# Patient Record
Sex: Female | Born: 1985 | Race: Black or African American | Hispanic: No | Marital: Married | Smoking: Never smoker
Health system: Southern US, Community
[De-identification: ages and names within clinical notes are randomized; demographics above are authoritative.]

## PROBLEM LIST (undated history)

## (undated) DIAGNOSIS — R87629 Unspecified abnormal cytological findings in specimens from vagina: Secondary | ICD-10-CM

## (undated) DIAGNOSIS — D649 Anemia, unspecified: Secondary | ICD-10-CM

## (undated) HISTORY — DX: Unspecified abnormal cytological findings in specimens from vagina: R87.629

## (undated) HISTORY — DX: Anemia, unspecified: D64.9

---

## 2011-09-11 DIAGNOSIS — Q6589 Other specified congenital deformities of hip: Secondary | ICD-10-CM

## 2016-01-12 ENCOUNTER — Encounter (INDEPENDENT_AMBULATORY_CARE_PROVIDER_SITE_OTHER): Payer: Self-pay

## 2016-01-12 ENCOUNTER — Encounter: Payer: Self-pay | Admitting: Certified Nurse Midwife

## 2016-01-12 ENCOUNTER — Encounter: Payer: Self-pay | Admitting: *Deleted

## 2016-01-12 ENCOUNTER — Ambulatory Visit (INDEPENDENT_AMBULATORY_CARE_PROVIDER_SITE_OTHER): Payer: Managed Care, Other (non HMO) | Admitting: Certified Nurse Midwife

## 2016-01-12 VITALS — BP 120/78 | HR 107 | Ht 65.0 in | Wt 170.0 lb

## 2016-01-12 DIAGNOSIS — Z23 Encounter for immunization: Secondary | ICD-10-CM

## 2016-01-12 DIAGNOSIS — O0932 Supervision of pregnancy with insufficient antenatal care, second trimester: Secondary | ICD-10-CM | POA: Diagnosis not present

## 2016-01-12 DIAGNOSIS — O0930 Supervision of pregnancy with insufficient antenatal care, unspecified trimester: Secondary | ICD-10-CM

## 2016-01-12 DIAGNOSIS — Z3689 Encounter for other specified antenatal screening: Secondary | ICD-10-CM | POA: Diagnosis not present

## 2016-01-12 DIAGNOSIS — Z113 Encounter for screening for infections with a predominantly sexual mode of transmission: Secondary | ICD-10-CM

## 2016-01-12 DIAGNOSIS — Z1151 Encounter for screening for human papillomavirus (HPV): Secondary | ICD-10-CM

## 2016-01-12 DIAGNOSIS — Z3480 Encounter for supervision of other normal pregnancy, unspecified trimester: Secondary | ICD-10-CM | POA: Insufficient documentation

## 2016-01-12 DIAGNOSIS — O9989 Other specified diseases and conditions complicating pregnancy, childbirth and the puerperium: Secondary | ICD-10-CM

## 2016-01-12 DIAGNOSIS — N898 Other specified noninflammatory disorders of vagina: Secondary | ICD-10-CM

## 2016-01-12 DIAGNOSIS — Z124 Encounter for screening for malignant neoplasm of cervix: Secondary | ICD-10-CM

## 2016-01-12 NOTE — Patient Instructions (Signed)
AREA PEDIATRIC/FAMILY PRACTICE PHYSICIANS  [facility] [address] [place], [place] [address] Phone - [phone]   Fax - [phone]  [facility] AM[facility] [address] [place], [place]  [address] Phone - [phone]   Fax - [phone]  [facility] [address] [place], [place]  [address] Phone - [phone]   Fax - [phone]  [facility] [address] [place], [place]  [address] Phone - [phone]   Fax - [phone]  [facility] [address], [address] [place], [place]  [address] Phone - [phone]   Fax - [phone]  [facility] [address] [place], [place]  [address] Phone - [phone]   Fax - [phone]  [facility] [address] [place], [place]  [address] Phone - [phone]   Fax - [phone]  [facility] [address], [address] [place], [place]  [address] Phone - [phone]   Fax - [phone]  [facility] [address] [place], [place]  [address] Phone - [phone]   Fax - [phone] [facility] [address] [place], [place]  [address] Phone - [phone]   Fax - [phone]  [facility] [address] [place], [place]  [address] Phone - [phone]   Fax - [phone]  [facility] [address] [place], [place]  [address] Phone - [phone]   Fax - [phone]  [facility] [address] [place], [place]  [address] Phone - [phone]   Fax - [phone]  SHILPA GOSRANI [address] [place], [place]  [address] Phone - [phone]  [facility] [address] [place], [place]  [address] Phone - [phone]   Fax - [phone]  [facility] [address] [place], [place]  [address] Phone - [phone]   Fax - [phone]  [facility] [address] [place], [place]  [address] Phone - [phone]   Fax - [phone]  [facility] [address] [place], [place]  [address] Phone - [phone]   Fax - [phone]   [facility] [address] [place], [place]  [address] Phone - [phone]   Fax - [phone]  [facility] [address] [place], [place]  [address] Phone - [phone]   Fax - [phone]  [facility] RUB[facility] [address] [place], [place]  [address] Phone - [phone]   Fax - [phone]  [facility] [address] [place], [place]  [address] Phone - [phone]   Fax - [phone]  [facility] [address] , [place]  [address] Phone - [phone]   Fax - [phone] [facility] [address] [place], [place]  [address] Phone - [phone]   Fax - [phone]  [facility] [address] [place], [place]  [address] Phone - [phone]   Fax - [phone]  [facility] [address] [place],   [address] Phone - [phone]   Fax - [phone]   

## 2016-01-12 NOTE — Progress Notes (Signed)
Pt here today as a NOB.  She has not had any previous OB care.  She desires a Systems developerwaterbirth.  She [place] one [place]  NOB labs drawn today and [place]

## 2016-01-12 NOTE — Progress Notes (Signed)
Subjective:  Kathleen PitmanChristina Morrison is a 30 y.o. G2P1001 at 8922w6d being seen today for her initial prenatal care Morrison.  She is currently monitored for the following issues for this low-risk pregnancy and has Supervision of other normal pregnancy, antepartum on her problem list.  Patient reports malodorous white vaginal discharge.  Contractions: Not present. Vag. Bleeding: None.  Movement: Present. Denies leaking of fluid.   The following portions of the patient's history were reviewed and updated as appropriate: allergies, current medications, past family history, past medical history, past social history, past surgical history and problem list. Problem list updated.  Objective:   Vitals:   01/12/16 0902 01/12/16 0910  BP: 120/78   Pulse: (!) 107   Weight: 170 lb (77.1 kg)   Height:  5\' 5"  (1.651 m)    Fetal Status: Fetal Heart Rate (bpm): 147 Fundal Height: 24 cm Movement: Present     General:  Alert, oriented and cooperative. Patient is in no acute distress.  Skin: Skin is warm and dry. No rash noted.   Cardiovascular: Normal heart rate and rhythm noted  Respiratory: Normal respiratory effort, no problems with respiration noted, CTAB  Abdomen: Soft, gravid, appropriate for gestational age. Pain/Pressure: Absent     Pelvic: Vag. Bleeding: None Vag D/C Character: Frothy  White, moderate Cervical exam performed Dilation: Closed Effacement (%): 0    Extremities: Normal range of motion.  Edema: None  Mental Status: Normal mood and affect. Normal behavior. Normal judgment and thought content.                    Breast: nml bilaterally  Urinalysis: Urine Protein: Negative Urine Glucose: Negative  Assessment and Plan:  Pregnancy: G2P1001 at 4822w6d  1. Supervision of other normal pregnancy, antepartum  - Flu Vaccine QUAD 36+ mos IM (Fluarix, Quad PF) - interested in WB, discussed class offerings and requirement  2. Late prenatal care  - Prenatal Profile - Hemoglobin A1c - Culture, OB  Urine - Cytology - PAP - Pain Mgmt, Profile 6 Conf w/o mM, U - Glucose - US MFM OB COMP + 14 WK; Future  3. Vaginal odor - likely BV based on exam - WET PREP FOR TRICH, YEAST, CLUE  Preterm labor symptoms and general obstetric precautions including but not limited to vaginal bleeding, contractions, leaking of fluid and fetal movement were reviewed in detail with the patient. Please refer to After Morrison Summary for other counseling recommendations.  Return in about 4 weeks (around 02/09/2016).   Donette LarryMelanie Janelie Goltz, CNM

## 2016-01-13 ENCOUNTER — Telehealth: Payer: Self-pay | Admitting: *Deleted

## 2016-01-13 DIAGNOSIS — N760 Acute vaginitis: Principal | ICD-10-CM

## 2016-01-13 DIAGNOSIS — B9689 Other specified bacterial agents as the cause of diseases classified elsewhere: Secondary | ICD-10-CM

## 2016-01-13 LAB — PRENATAL PROFILE (SOLSTAS)
Antibody Screen: NEGATIVE
BASOS ABS: 0 {cells}/uL (ref 0–200)
Basophils Relative: 0 %
EOS ABS: 48 {cells}/uL (ref 15–500)
Eosinophils Relative: 1 %
HEMATOCRIT: 33.6 % — AB (ref 35.0–45.0)
HIV: NONREACTIVE
Hemoglobin: 10.7 g/dL — ABNORMAL LOW (ref 11.7–15.5)
Hepatitis B Surface Ag: NEGATIVE
LYMPHS ABS: 1104 {cells}/uL (ref 850–3900)
Lymphocytes Relative: 23 %
MCH: 28.1 pg (ref 27.0–33.0)
MCHC: 31.8 g/dL — ABNORMAL LOW (ref 32.0–36.0)
MCV: 88.2 fL (ref 80.0–100.0)
MPV: 9.4 fL (ref 7.5–12.5)
Monocytes Absolute: 288 cells/uL (ref 200–950)
Monocytes Relative: 6 %
NEUTROS PCT: 70 %
Neutro Abs: 3360 cells/uL (ref 1500–7800)
Platelets: 240 10*3/uL (ref 140–400)
RBC: 3.81 MIL/uL (ref 3.80–5.10)
RDW: 14.4 % (ref 11.0–15.0)
Rh Type: POSITIVE
Rubella: 2.35 Index — ABNORMAL HIGH (ref <0.90)
WBC: 4.8 10*3/uL (ref 3.8–10.8)

## 2016-01-13 LAB — PAIN MGMT, PROFILE 6 CONF W/O MM, U
6 ACETYLMORPHINE: NEGATIVE ng/mL (ref <10)
ALCOHOL METABOLITES: NEGATIVE ng/mL (ref <500)
Amphetamines: NEGATIVE ng/mL (ref <500)
BARBITURATES: NEGATIVE ng/mL (ref <300)
Benzodiazepines: NEGATIVE ng/mL (ref <100)
Cocaine Metabolite: NEGATIVE ng/mL (ref <150)
Creatinine: 197.4 mg/dL (ref >=20.0)
METHADONE METABOLITE: NEGATIVE ng/mL (ref <100)
Marijuana Metabolite: NEGATIVE ng/mL (ref <20)
Opiates: NEGATIVE ng/mL (ref <100)
Oxidant: NEGATIVE ug/mL (ref <200)
Oxycodone: NEGATIVE ng/mL (ref <100)
PH: 6.36 (ref 4.5–9.0)
PHENCYCLIDINE: NEGATIVE ng/mL (ref <25)
Please note:: 0

## 2016-01-13 LAB — GLUCOSE, RANDOM: GLUCOSE: 81 mg/dL (ref 65–99)

## 2016-01-13 LAB — WET PREP FOR TRICH, YEAST, CLUE
TRICH WET PREP: NONE SEEN
YEAST WET PREP: NONE SEEN

## 2016-01-13 LAB — HEMOGLOBIN A1C
Hgb A1c MFr Bld: 5 % (ref <5.7)
MEAN PLASMA GLUCOSE: 97 mg/dL

## 2016-01-13 MED ORDER — METRONIDAZOLE 500 MG PO TABS: 500.0000 mg | ORAL_TABLET | Freq: Two times a day (BID) | ORAL | 0 refills | Status: DC

## 2016-01-13 NOTE — Telephone Encounter (Signed)
Pt notified of positive BV and [facility] Marice Potterove.

## 2016-01-14 LAB — CYTOLOGY - PAP
Chlamydia: NEGATIVE
Diagnosis: NEGATIVE
HPV: NOT DETECTED
Neisseria Gonorrhea: NEGATIVE

## 2016-01-14 LAB — CULTURE, OB URINE
COLONY COUNT: NO GROWTH
ORGANISM ID, BACTERIA: NO GROWTH

## 2016-01-19 ENCOUNTER — Ambulatory Visit (HOSPITAL_COMMUNITY)
Admission: RE | Admit: 2016-01-19 | Discharge: 2016-01-19 | Disposition: A | Discharge status: Home / Self Care | Payer: Managed Care, Other (non HMO) | Source: Ambulatory Visit | Attending: Certified Nurse Midwife | Admitting: Certified Nurse Midwife

## 2016-01-19 DIAGNOSIS — O0932 Supervision of pregnancy with insufficient antenatal care, second trimester: Secondary | ICD-10-CM | POA: Diagnosis not present

## 2016-01-19 DIAGNOSIS — Z3A24 24 weeks gestation of pregnancy: Secondary | ICD-10-CM | POA: Insufficient documentation

## 2016-01-19 DIAGNOSIS — O0930 Supervision of pregnancy with insufficient antenatal care, unspecified trimester: Secondary | ICD-10-CM

## 2016-01-19 DIAGNOSIS — Z3480 Encounter for supervision of other normal pregnancy, unspecified trimester: Secondary | ICD-10-CM

## 2016-01-19 DIAGNOSIS — Z363 Encounter for antenatal screening for malformations: Secondary | ICD-10-CM | POA: Insufficient documentation

## 2016-01-21 ENCOUNTER — Encounter: Payer: Self-pay | Admitting: Family Medicine

## 2016-01-22 NOTE — Addendum Note (Signed)
Encounter addended by: Donette LarryMelanie Sajad Glander, CNM on: 01/22/2016  8:43 AM<BR>    Actions taken: Visit diagnoses modified, Problem List modified

## 2016-02-02 NOTE — L&D Delivery Note (Signed)
Delivery Note At 12:44 AM a viable female was delivered via waterbirth   (Presentation: LOA ; nuchal cord around foot x 2  ).  APGAR: pending, ; weight  .   Placenta status: complete and intact.  Cord:  with the following complications: .  Cord pH: NA  Anesthesia:  None  Episiotomy:  None  Lacerations:  Hemostatic first degree, no repair Suture Repair: NA Est. Blood Loss (mL):  200 cc   Mom to postpartum.  Baby to Couplet care / Skin to Skin.  Tawnya Crook 05/06/2016, 1:13 AM

## 2016-02-13 ENCOUNTER — Ambulatory Visit (INDEPENDENT_AMBULATORY_CARE_PROVIDER_SITE_OTHER): Payer: Managed Care, Other (non HMO) | Admitting: Advanced Practice Midwife

## 2016-02-13 VITALS — BP 134/73 | HR 105 | Wt 175.0 lb

## 2016-02-13 DIAGNOSIS — Z3492 Encounter for supervision of normal pregnancy, unspecified, second trimester: Secondary | ICD-10-CM

## 2016-02-13 DIAGNOSIS — Z3493 Encounter for supervision of normal pregnancy, unspecified, third trimester: Secondary | ICD-10-CM

## 2016-02-13 DIAGNOSIS — Z3480 Encounter for supervision of other normal pregnancy, unspecified trimester: Secondary | ICD-10-CM

## 2016-02-13 DIAGNOSIS — O26813 Pregnancy related exhaustion and fatigue, third trimester: Secondary | ICD-10-CM

## 2016-02-13 DIAGNOSIS — Z23 Encounter for immunization: Secondary | ICD-10-CM

## 2016-02-13 LAB — TSH: TSH: 1.3 mIU/L

## 2016-02-13 LAB — CBC
HCT: 31.3 % — ABNORMAL LOW (ref 35.0–45.0)
HEMOGLOBIN: 10 g/dL — AB (ref 11.7–15.5)
MCH: 28.2 pg (ref 27.0–33.0)
MCHC: 31.9 g/dL — AB (ref 32.0–36.0)
MCV: 88.2 fL (ref 80.0–100.0)
MPV: 9.3 fL (ref 7.5–12.5)
PLATELETS: 222 10*3/uL (ref 140–400)
RBC: 3.55 MIL/uL — ABNORMAL LOW (ref 3.80–5.10)
RDW: 14.4 % (ref 11.0–15.0)
WBC: 5.1 10*3/uL (ref 3.8–10.8)

## 2016-02-13 NOTE — Patient Instructions (Signed)
Considering Waterbirth? Guide for patients at [facility]  Why consider waterbirth?  . Gentle birth for babies . Less pain medicine used in labor . May allow for passive descent/less pushing . May reduce perineal tears  . More mobility and instinctive maternal position changes . Increased maternal relaxation . Reduced blood pressure in labor  Is waterbirth safe? What are the risks of infection, drowning or other complications?  . Infection: o Very low risk (3.7 % for tub vs 4.8% for bed) o 7 in 8000 waterbirths with documented infection o Poorly cleaned equipment most common cause o Slightly lower group B strep transmission rate  . Drowning o Maternal:  - Very low risk   - Related to seizures or fainting o Newborn:  - Very low risk. No evidence of increased risk of respiratory problems in multiple large studies - Physiological protection from breathing under water - Avoid underwater birth if there are any fetal complications - Once baby's head is out of the water, keep it out.  . Birth complication o Some reports of cord trauma, but risk decreased by bringing baby to surface gradually o No evidence of increased risk of shoulder dystocia. Mothers can usually change positions faster in water than in a bed, possibly aiding the maneuvers to free the shoulder.   Am I a candidate for waterbirth?  Yes, if you are: . Full-term (37 weeks or greater)  . Have had an uncomplicated pregnancy and labor  No, if you have: Marland Kitchen Preterm birth less than 37 weeks . Thick, particulate meconium stained fluid . Maternal fever over 101 . Heavy bleeding or signs of placental abruption . Pre-eclampsia  . Any abnormal fetal heart rate pattern . Breech presentation . Twins  . Very large baby . Active communicable infection (this does NOT include group B strep) . Significant limitation to mobility  Please remember that birth is unpredictable. Under certain unforeseeable  circumstances your provider may advise against giving birth in the tub. These decisions will be made on a case-by-case basis and with the safety of you and your baby as our highest priority.  Requirements for patients planning waterbirth  . Ask your midwife if you will be a candidate for waterbirth. . Attend the Barney Drain at [place] at [phone] or [phone] for dates and times. The class is free and we strongly encourage you to bring your support person. You will receive a certificate of participation to show to your midwife or doctor. . Supplies needed for [facility] and [facility] patients: o Single-use disposable tub liner (birthpoolinabox.com  REGULAR size) o New garden hose labeled "lead-free", "suitable for drinking water", "non-toxic" OR "water potable" o Garden hose to remove the dirty water o Faucet adaptor to attach hose to faucet         o Electric drain pump to remove water (We recommend 792 gallon per hour or greater pump.)  o Fish net o Bathing suit top (optional) o Long-handled mirror (optional)  [employer] sells tubs for $120 if you would rather purchase your own tub    Thinking About Waterbirth???  You must attend a Doren Custard class at [facility]  3rd Wednesday of every month from 7-9pm  [employer] by calling [phone] or online at [employer]Federal.[employer]  Bring [place] the certificate from the class  Waterbirth supplies needed for [facility] patients:  Our practice has a Heritage manager in a Box tub at the hospital that you can  borrow  You will need to purchase an accessory kit that has all needed supplies through [facility] [phone]) or online $175.00  Or you can purchase the supplies separately: o Single-use disposable tub liner for Birth Pool in a Box (REGULAR size) o New garden hose labeled "lead-free", "suitable for drinking  water", o Electric drain pump to remove water (We recommend 792 gallon per hour or greater pump.)  o  "non-toxic" OR "water potable" o Garden hose to remove the dirty water o Fish net o Bathing suit top (optional) o Long-handled mirror (optional)  [employer] sells tubs for ~ $120 if you would rather purchase your own tub.  They also sell accessories, liners.    Www.waterbirthsolutions.com for tub purchases and supplies  The Labor Ladies (www.thelaborladies.com) $275 for tub rental/set-up & take down/kit   [employer] information regarding doulas (labor support) who provide pool rentals:  IdentityList.se.htm   The Labor Ladies (www.thelaborladies.com)  IdentityList.se.htm   Things that would prevent you from having a waterbirth:  Premature, <37wks  Previous cesarean birth  Presence of thick meconium-stained fluid  Multiple gestation (Twins, triplets, etc.)  Uncontrolled diabetes or gestational diabetes requiring medication  Hypertension  Heavy vaginal bleeding  Non-reassuring fetal heart rate  Active infection (MRSA, etc.)  If your labor has to be induced and induction method requires continuous monitoring of the baby's heart rate  Other risks/issues identified by your obstetrical provider

## 2016-02-13 NOTE — Progress Notes (Signed)
To return next week for 28 week labs

## 2016-02-13 NOTE — Progress Notes (Signed)
   PRENATAL VISIT NOTE  Subjective:  Ginette PitmanChristina Donigan is a 31 y.o. G2P1001 at 2456w3d being seen today for ongoing prenatal care.  She is currently monitored for the following issues for this low-risk pregnancy and has Supervision of other normal pregnancy, antepartum and Late prenatal care affecting pregnancy, antepartum on her problem list.  Patient reports right hip pain, severe fatigue, severe mood swings, tearfulness. .  Contractions: Not present. Vag. Bleeding: None.  Movement: Present. Denies leaking of fluid.   The following portions of the patient's history were reviewed and updated as appropriate: allergies, current medications, past family history, past medical history, past social history, past surgical history and problem list. Problem list updated.  Objective:   Vitals:   02/13/16 0832  BP: 134/73  Pulse: (!) 105  Weight: 175 lb (79.4 kg)    Fetal Status: Fetal Heart Rate (bpm): 147 Fundal Height: 28 cm Movement: Present     General:  Alert, oriented and cooperative. Patient is in no acute distress.  Skin: Skin is warm and dry. No rash noted.   Cardiovascular: Normal heart rate noted  Respiratory: Normal respiratory effort, no problems with respiration noted  Abdomen: Soft, gravid, appropriate for gestational age. Pain/Pressure: Absent     Pelvic:  Cervical exam deferred        Extremities: Normal range of motion.  Edema: None  Mental Status: Normal mood and affect. Normal behavior. Normal judgment and thought content.   Assessment and Plan:  Pregnancy: G2P1001 at 4356w3d  1. Normal pregnancy in second trimester  (pt ate so 2 hour GTT scheduled for [place] - CBC - HIV antibody (with reflex) - RPR - Tdap vaccine greater than or equal to 7yo IM - Glucose Tolerance, 2 Hours w/1 Hour  2. Supervision of other normal pregnancy, antepartum   3. Pregnancy related fatigue in third trimester - TSH  Preterm labor symptoms and general obstetric precautions including but not  limited to vaginal bleeding, contractions, leaking of fluid and fetal movement were reviewed in detail with the patient. Please refer to After Visit Summary for other counseling recommendations.  Recommend maternity support belt, counseling, walks, prenatal yoga. Not interested in meds yet.  Discussed normal difference w/ second pregnancy. Return in 2 weeks (on 02/27/2016).   Dorathy KinsmanVirginia Leonidus Rowand, CNM

## 2016-02-14 LAB — RPR

## 2016-02-14 LAB — HIV ANTIBODY (ROUTINE TESTING W REFLEX): HIV: NONREACTIVE

## 2016-02-17 ENCOUNTER — Telehealth: Payer: Self-pay | Admitting: *Deleted

## 2016-02-17 NOTE — Telephone Encounter (Signed)
Pt notified of labs and she will start FeSo4 and Colace daily.

## 2016-02-17 NOTE — Telephone Encounter (Signed)
-----   Message from Kathleen Morrison, [place] sent at 02/17/2016  8:17 AM EST ----- Please inform pt of Nml labs except borderline Hgb. Recommend OTC FeSO4 QD or may take BID if she can tolerate it since she's very fatigued.

## 2016-02-18 LAB — GLUCOSE TOLERANCE, 2 HOURS W/ 1HR
GLUCOSE, FASTING: 82 mg/dL (ref 65–99)
Glucose, 1 hour: 136 mg/dL
Glucose, 2 hour: 106 mg/dL (ref <140)

## 2016-02-27 ENCOUNTER — Encounter: Payer: Managed Care, Other (non HMO) | Admitting: Advanced Practice Midwife

## 2016-03-01 ENCOUNTER — Ambulatory Visit (INDEPENDENT_AMBULATORY_CARE_PROVIDER_SITE_OTHER): Payer: BLUE CROSS/BLUE SHIELD | Admitting: Advanced Practice Midwife

## 2016-03-01 ENCOUNTER — Encounter: Payer: Self-pay | Admitting: Advanced Practice Midwife

## 2016-03-01 VITALS — BP 114/78 | HR 121 | Wt 176.0 lb

## 2016-03-01 DIAGNOSIS — Z3480 Encounter for supervision of other normal pregnancy, unspecified trimester: Secondary | ICD-10-CM

## 2016-03-01 DIAGNOSIS — O0933 Supervision of pregnancy with insufficient antenatal care, third trimester: Secondary | ICD-10-CM

## 2016-03-01 DIAGNOSIS — O0930 Supervision of pregnancy with insufficient antenatal care, unspecified trimester: Secondary | ICD-10-CM

## 2016-03-01 NOTE — Progress Notes (Signed)
   PRENATAL VISIT NOTE  Subjective:  Kathleen Morrison is a 31 y.o. G2P1001 at 7556w6d being seen today for ongoing prenatal care.  She is currently monitored for the following issues for this low-risk pregnancy and has Supervision of other normal pregnancy, antepartum and Late prenatal care affecting pregnancy, antepartum on her problem list.  Patient reports fatigue and heartburn.  Contractions: Not present. Vag. Bleeding: None.  Movement: Present. Denies leaking of fluid.   The following portions of the patient's history were reviewed and updated as appropriate: allergies, current medications, past family history, past medical history, past social history, past surgical history and problem list. Problem list updated.  Objective:   Vitals:   03/01/16 0840  BP: 114/78  Pulse: (!) 121  Weight: 176 lb (79.8 kg)    Fetal Status: Fetal Heart Rate (bpm): 148 Fundal Height: 30 cm Movement: Present  Presentation: Vertex  General:  Alert, oriented and cooperative. Patient is in no acute distress.  Skin: Skin is warm and dry. No rash noted.   Cardiovascular: Normal heart rate noted  Respiratory: Normal respiratory effort, no problems with respiration noted  Abdomen: Soft, gravid, appropriate for gestational age. Pain/Pressure: Present     Pelvic:  Cervical exam deferred        Extremities: Normal range of motion.  Edema: None  Mental Status: Normal mood and affect. Normal behavior. Normal judgment and thought content.   Assessment and Plan:  Pregnancy: G2P1001 at 6856w6d  1. Supervision of other normal pregnancy, antepartum   2. Late prenatal care affecting pregnancy, antepartum   Preterm labor symptoms and general obstetric precautions including but not limited to vaginal bleeding, contractions, leaking of fluid and fetal movement were reviewed in detail with the patient. Please refer to After Visit Summary for other counseling recommendations.  Discussed dietary changes to help w/  reflux and increase iron.  Pepcid PRN Return in 2 weeks (on 03/15/2016).   Dorathy KinsmanVirginia Suhayla Chisom, CNM

## 2016-03-01 NOTE — Patient Instructions (Signed)
Iron-Rich Diet Introduction Iron is a mineral that helps your body to produce hemoglobin. Hemoglobin is a protein in your red blood cells that carries oxygen to your body's tissues. Eating too little iron may cause you to feel weak and tired, and it can increase your risk for infection. Eating enough iron is necessary for your body's metabolism, muscle function, and nervous system. Iron is naturally found in many foods. It can also be added to foods or fortified in foods. There are two types of dietary iron:  Heme iron. Heme iron is absorbed by the body more easily than nonheme iron. Heme iron is found in meat, poultry, and fish.  Nonheme iron. Nonheme iron is found in dietary supplements, iron-fortified grains, beans, and vegetables. You may need to follow an iron-rich diet if:  You have been diagnosed with iron deficiency or iron-deficiency anemia.  You have a condition that prevents you from absorbing dietary iron, such as:  Infection in your intestines.  Celiac disease. This involves long-lasting (chronic) inflammation of your intestines.  You do not eat enough iron.  You eat a diet that is high in foods that impair iron absorption.  You have lost a lot of blood.  You have heavy bleeding during your menstrual cycle.  You are pregnant. What is my plan? Your health care provider may help you to determine how much iron you need per day based on your condition. Generally, when a person consumes sufficient amounts of iron in the diet, the following iron needs are met:  Men.  35-41 years old: 11 mg per day.  73-46 years old: 8 mg per day.  Women.  5-74 years old: 15 mg per day.  102-56 years old: 18 mg per day.  Over 63 years old: 8 mg per day.  Pregnant women: 27 mg per day.  Breastfeeding women: 9 mg per day. What do I need to know about an iron-rich diet?  Eat fresh fruits and vegetables that are high in vitamin C along with foods that are high in iron. This will  help increase the amount of iron that your body absorbs from food, especially with foods containing nonheme iron. Foods that are high in vitamin C include oranges, peppers, tomatoes, and mango.  Take iron supplements only as directed by your health care provider. Overdose of iron can be life-threatening. If you were prescribed iron supplements, take them with orange juice or a vitamin C supplement.  Cook foods in pots and pans that are made from iron.  Eat nonheme iron-containing foods alongside foods that are high in heme iron. This helps to improve your iron absorption.  Certain foods and drinks contain compounds that impair iron absorption. Avoid eating these foods in the same meal as iron-rich foods or with iron supplements. These include:  Coffee, black tea, and red wine.  Milk, dairy products, and foods that are high in calcium.  Beans, soybeans, and peas.  Whole grains.  When eating foods that contain both nonheme iron and compounds that impair iron absorption, follow these tips to absorb iron better.  Soak beans overnight before cooking.  Soak whole grains overnight and drain them before using.  Ferment flours before baking, such as using yeast in bread dough. What foods can I eat? Grains  Iron-fortified breakfast cereal. Iron-fortified whole-wheat bread. Enriched rice. Sprouted grains. Vegetables  Spinach. Potatoes with skin. Green peas. Broccoli. Red and green bell peppers. Fermented vegetables. Fruits  Prunes. Raisins. Oranges. Strawberries. Mango. Grapefruit. Meats and Other Protein  Sources  Beef liver. Oysters. Beef. Shrimp. Turkey. Chicken. Tuna. Sardines. Chickpeas. Nuts. Tofu. Beverages  Tomato juice. Fresh orange juice. Prune juice. Hibiscus tea. Fortified instant breakfast shakes. Condiments  Tahini. Fermented soy sauce. Sweets and Desserts  Black-strap molasses. Other  Wheat germ. The items listed above may not be a complete list of recommended foods or  beverages. Contact your dietitian for more options.  What foods are not recommended? Grains  Whole grains. Bran cereal. Bran flour. Oats. Vegetables  Artichokes. Brussels sprouts. Kale. Fruits  Blueberries. Raspberries. Strawberries. Figs. Meats and Other Protein Sources  Soybeans. Products made from soy protein. Dairy  Milk. Cream. Cheese. Yogurt. Cottage cheese. Beverages  Coffee. Black tea. Red wine. Sweets and Desserts  Cocoa. Chocolate. Ice cream. Other  Basil. Oregano. Parsley. The items listed above may not be a complete list of foods and beverages to avoid. Contact your dietitian for more information.  This information is not intended to replace advice given to you by your health care provider. Make sure you discuss any questions you have with your health care provider. Document Released: 09/01/2004 Document Revised: 08/08/2015 Document Reviewed: 08/15/2013  2017 Elsevier  

## 2016-03-15 ENCOUNTER — Encounter: Payer: Managed Care, Other (non HMO) | Admitting: Family

## 2016-03-15 ENCOUNTER — Ambulatory Visit (INDEPENDENT_AMBULATORY_CARE_PROVIDER_SITE_OTHER): Payer: BLUE CROSS/BLUE SHIELD | Admitting: Family

## 2016-03-15 VITALS — BP 116/79 | HR 107 | Wt 175.0 lb

## 2016-03-15 DIAGNOSIS — K2100 Gastro-esophageal reflux disease with esophagitis, without bleeding: Secondary | ICD-10-CM

## 2016-03-15 DIAGNOSIS — Z3480 Encounter for supervision of other normal pregnancy, unspecified trimester: Secondary | ICD-10-CM

## 2016-03-15 DIAGNOSIS — O0930 Supervision of pregnancy with insufficient antenatal care, unspecified trimester: Secondary | ICD-10-CM

## 2016-03-15 DIAGNOSIS — O0933 Supervision of pregnancy with insufficient antenatal care, third trimester: Secondary | ICD-10-CM

## 2016-03-15 MED ORDER — PANTOPRAZOLE SODIUM 20 MG PO TBEC: 20.0000 mg | DELAYED_RELEASE_TABLET | Freq: Every day | ORAL | 2 refills | Status: DC

## 2016-03-15 NOTE — Progress Notes (Signed)
   PRENATAL VISIT NOTE  Subjective:  Kathleen Morrison is a 31 y.o. G2P1001 at 4470w6d being seen today for ongoing prenatal care.  She is currently monitored for the following issues for this low-risk pregnancy and has Supervision of other normal pregnancy, antepartum; Late prenatal care affecting pregnancy, antepartum; and Gastroesophageal reflux disease with esophagitis on her problem list.  Patient reports increased reflux, with intermittent vomiting.  Contractions: Not present. Vag. Bleeding: None.  Movement: Present. Denies leaking of fluid.   The following portions of the patient's history were reviewed and updated as appropriate: allergies, current medications, past family history, past medical history, past social history, past surgical history and problem list. Problem list updated.  Objective:   Vitals:   03/15/16 0846  BP: 116/79  Pulse: (!) 107  Weight: 175 lb (79.4 kg)    Fetal Status: Fetal Heart Rate (bpm): 156   Movement: Present     General:  Alert, oriented and cooperative. Patient is in no acute distress.  Skin: Skin is warm and dry. No rash noted.   Cardiovascular: Normal heart rate noted  Respiratory: Normal respiratory effort, no problems with respiration noted  Abdomen: Soft, gravid, appropriate for gestational age. Pain/Pressure: Present     Pelvic:  Cervical exam deferred        Extremities: Normal range of motion.  Edema: None  Mental Status: Normal mood and affect. Normal behavior. Normal judgment and thought content.   Assessment and Plan:  Pregnancy: G2P1001 at 6270w6d  1. Gastroesophageal reflux disease with esophagitis - RX Protonix 20 mg qd  2. Supervision of other normal pregnancy, antepartum - Reviewed plans for waterbirth birth - Reviewed dating ultrasounds (remains same)  Preterm labor symptoms and general obstetric precautions including but not limited to vaginal bleeding, contractions, leaking of fluid and fetal movement were reviewed in  detail with the patient. Please refer to After Visit Summary for other counseling recommendations.  Return in about 2 weeks (around 03/29/2016).   Eino FarberWalidah Kennith GainN Karim, CNM

## 2016-03-29 ENCOUNTER — Ambulatory Visit (INDEPENDENT_AMBULATORY_CARE_PROVIDER_SITE_OTHER): Payer: BLUE CROSS/BLUE SHIELD | Admitting: Certified Nurse Midwife

## 2016-03-29 VITALS — BP 113/72 | HR 85 | Wt 181.0 lb

## 2016-03-29 DIAGNOSIS — N898 Other specified noninflammatory disorders of vagina: Secondary | ICD-10-CM

## 2016-03-29 DIAGNOSIS — Z3480 Encounter for supervision of other normal pregnancy, unspecified trimester: Secondary | ICD-10-CM

## 2016-03-29 DIAGNOSIS — N949 Unspecified condition associated with female genital organs and menstrual cycle: Secondary | ICD-10-CM

## 2016-03-29 DIAGNOSIS — Z3483 Encounter for supervision of other normal pregnancy, third trimester: Secondary | ICD-10-CM

## 2016-03-29 NOTE — Progress Notes (Signed)
Subjective:  Kathleen Morrison is a 31 y.o. G2P1001 at 10373w6d being seen today for ongoing prenatal care.  She is currently monitored for the following issues for this low-risk pregnancy and has Supervision of other normal pregnancy, antepartum; Late prenatal care affecting pregnancy, antepartum; and Gastroesophageal reflux disease with esophagitis on her problem list.  Patient reports red areas in genital area and milky vaginal discharge. Denies pain, irritation, itching, or malodor. .  Contractions: Not present. Vag. Bleeding: None.  Movement: Present. Denies leaking of fluid.   The following portions of the patient's history were reviewed and updated as appropriate: allergies, current medications, past family history, past medical history, past social history, past surgical history and problem list. Problem list updated.  Objective:   Vitals:   03/29/16 0852  BP: 113/72  Pulse: 85  Weight: 181 lb (82.1 kg)    Fetal Status: Fetal Heart Rate (bpm): 140 Fundal Height: 35 cm Movement: Present  Presentation: Vertex  General:  Alert, oriented and cooperative. Patient is in no acute distress.  Skin: Skin is warm and dry. No rash noted.   Cardiovascular: Normal heart rate noted  Respiratory: Normal respiratory effort, no problems with respiration noted  Abdomen: Soft, gravid, appropriate for gestational age. Pain/Pressure: Present     Pelvic: Vag. Bleeding: None Vag D/C Character: Thin   Cervical exam deferred       several round, non-ulcerated, 1-522mm erythematous lesions above clitorus just below mons pubis. Thin white vaginal discharge noted.  Extremities: Normal range of motion.  Edema: None  Mental Status: Normal mood and affect. Normal behavior. Normal judgment and thought content.   Urinalysis:      Assessment and Plan:  Pregnancy: G2P1001 at 7073w6d  1. Supervision of other normal pregnancy, antepartum - planning waterbirth -certificate in media tab -sign consent next visit -GBS  next visit  2. Vaginal discharge - Cervicovaginal ancillary only  3. Genital lesions - Herpes simplex virus culture - not suspicious for HSV but culture sent  Preterm labor symptoms and general obstetric precautions including but not limited to vaginal bleeding, contractions, leaking of fluid and fetal movement were reviewed in detail with the patient. Please refer to After Visit Summary for other counseling recommendations.  Return in about 2 weeks (around 04/12/2016).   Donette LarryMelanie Lyvonne Cassell, CNM

## 2016-03-30 ENCOUNTER — Telehealth: Payer: Self-pay | Admitting: *Deleted

## 2016-03-30 DIAGNOSIS — B3731 Acute candidiasis of vulva and vagina: Secondary | ICD-10-CM

## 2016-03-30 DIAGNOSIS — B373 Candidiasis of vulva and vagina: Secondary | ICD-10-CM

## 2016-03-30 LAB — CERVICOVAGINAL ANCILLARY ONLY
BACTERIAL VAGINITIS: NEGATIVE
CANDIDA VAGINITIS: POSITIVE — AB

## 2016-03-30 MED ORDER — FLUCONAZOLE 150 MG PO TABS: 150.0000 mg | ORAL_TABLET | Freq: Once | ORAL | 0 refills | Status: AC

## 2016-03-30 NOTE — Telephone Encounter (Signed)
-----   Message from Donette LarryMelanie Bhambri, [place] sent at 03/30/2016  4:13 PM EST ----- Regarding: yeast inf Please send Rx for Diflucan 150 mg po x1 for yeast infection. Thanks.

## 2016-03-30 NOTE — Telephone Encounter (Signed)
Pt notified of positive yeast and per Fabian NovemberM Bhambri RX for Diflucan was sent to her pharmacy.

## 2016-04-01 LAB — HERPES SIMPLEX VIRUS CULTURE: Organism ID, Bacteria: NOT DETECTED

## 2016-04-12 ENCOUNTER — Ambulatory Visit (INDEPENDENT_AMBULATORY_CARE_PROVIDER_SITE_OTHER): Payer: BLUE CROSS/BLUE SHIELD | Admitting: Advanced Practice Midwife

## 2016-04-12 ENCOUNTER — Encounter: Payer: Self-pay | Admitting: Advanced Practice Midwife

## 2016-04-12 DIAGNOSIS — Z3483 Encounter for supervision of other normal pregnancy, third trimester: Secondary | ICD-10-CM

## 2016-04-12 DIAGNOSIS — Z3480 Encounter for supervision of other normal pregnancy, unspecified trimester: Secondary | ICD-10-CM

## 2016-04-12 NOTE — Patient Instructions (Signed)

## 2016-04-12 NOTE — Progress Notes (Signed)
   PRENATAL VISIT NOTE  Subjective:  Kathleen Morrison is a 31 y.o. G2P1001 at 2564w6d being seen today for ongoing prenatal care.  She is currently monitored for the following issues for this low-risk pregnancy and has Supervision of other normal pregnancy, antepartum; Late prenatal care affecting pregnancy, antepartum; and Gastroesophageal reflux disease with esophagitis on her problem list.  Patient reports no complaints.  Contractions: Not present. Vag. Bleeding: None.  Movement: Present. Denies leaking of fluid.   The following portions of the patient's history were reviewed and updated as appropriate: allergies, current medications, past family history, past medical history, past social history, past surgical history and problem list. Problem list updated.  Objective:   Vitals:   04/12/16 0907  BP: (!) 134/91  Pulse: (!) 114  Weight: 180 lb (81.6 kg)    Fetal Status: Fetal Heart Rate (bpm): 150   Movement: Present     General:  Alert, oriented and cooperative. Patient is in no acute distress.  Skin: Skin is warm and dry. No rash noted.   Cardiovascular: Normal heart rate noted  Respiratory: Normal respiratory effort, no problems with respiration noted  Abdomen: Soft, gravid, appropriate for gestational age. Pain/Pressure: Absent     Pelvic:  Cervical exam deferred       Declines cervix check  Extremities: Normal range of motion.  Edema: None  Mental Status: Normal mood and affect. Normal behavior. Normal judgment and thought content.   Assessment and Plan:  Pregnancy: G2P1001 at 5864w6d  1. Supervision of other normal pregnancy, antepartum  - Culture, beta strep (group b only) - Urine cytology ancillary only  Term labor symptoms and general obstetric precautions including but not limited to vaginal bleeding, contractions, leaking of fluid and fetal movement were reviewed in detail with the patient. Please refer to After Visit Summary for other counseling recommendations.    Return in about 1 week (around 04/19/2016[place]   Aviva SignsMarie L Izadora Roehr, CNM

## 2016-04-13 LAB — CULTURE, BETA STREP (GROUP B ONLY)

## 2016-04-14 LAB — URINE CYTOLOGY ANCILLARY ONLY
Chlamydia: NEGATIVE
NEISSERIA GONORRHEA: NEGATIVE

## 2016-04-15 ENCOUNTER — Encounter: Payer: Self-pay | Admitting: Advanced Practice Midwife

## 2016-04-15 DIAGNOSIS — O99820 Streptococcus B carrier state complicating pregnancy: Secondary | ICD-10-CM | POA: Insufficient documentation

## 2016-04-19 ENCOUNTER — Ambulatory Visit (INDEPENDENT_AMBULATORY_CARE_PROVIDER_SITE_OTHER): Payer: BLUE CROSS/BLUE SHIELD | Admitting: Advanced Practice Midwife

## 2016-04-19 DIAGNOSIS — Z3480 Encounter for supervision of other normal pregnancy, unspecified trimester: Secondary | ICD-10-CM

## 2016-04-19 DIAGNOSIS — Z3483 Encounter for supervision of other normal pregnancy, third trimester: Secondary | ICD-10-CM

## 2016-04-19 NOTE — Progress Notes (Signed)
   PRENATAL VISIT NOTE  Subjective:  Kathleen Morrison is a 31 y.o. G2P1001 at 2144w6d being seen today for ongoing prenatal care.  She is currently monitored for the following issues for this low-risk pregnancy and has Supervision of other normal pregnancy, antepartum; Late prenatal care affecting pregnancy, antepartum; Gastroesophageal reflux disease with esophagitis; and GBS (group B Streptococcus carrier), +RV culture, currently pregnant on her problem list.  Patient reports no complaints.  Contractions: Not present. Vag. Bleeding: None.  Movement: Present. Denies leaking of fluid.   The following portions of the patient's history were reviewed and updated as appropriate: allergies, current medications, past family history, past medical history, past social history, past surgical history and problem list. Problem list updated.  Objective:   Vitals:   04/19/16 0842  BP: 120/74  Pulse: 94  Weight: 181 lb (82.1 kg)    Fetal Status: Fetal Heart Rate (bpm): 146   Movement: Present     General:  Alert, oriented and cooperative. Patient is in no acute distress.  Skin: Skin is warm and dry. No rash noted.   Cardiovascular: Normal heart rate noted  Respiratory: Normal respiratory effort, no problems with respiration noted  Abdomen: Soft, gravid, appropriate for gestational age. Pain/Pressure: Present     Pelvic:  Cervical exam performed        Extremities: Normal range of motion.  Edema: None  Mental Status: Normal mood and affect. Normal behavior. Normal judgment and thought content.   Assessment and Plan:  Pregnancy: G2P1001 at 6444w6d  There are no diagnoses linked to this encounter. Term labor symptoms and general obstetric precautions including but not limited to vaginal bleeding, contractions, leaking of fluid and fetal movement were reviewed in detail with the patient. Please refer to After Visit Summary for other counseling recommendations.   Discussed GBS and treatment.  Still  plans waterbirth Using [facility]  Kathleen Morrison, CNM

## 2016-04-19 NOTE — Patient Instructions (Signed)
Third Trimester of Pregnancy The third trimester is from week 28 through week 40 (months 7 through 9). The third trimester is a time when the unborn baby (fetus) is growing rapidly. At the end of the ninth month, the fetus is about 20 inches in length and weighs 6-10 pounds. Body changes during your third trimester Your body will continue to go through many changes during pregnancy. The changes vary from woman to woman. During the third trimester:  Your weight will continue to increase. You can expect to gain 25-35 pounds (11-16 kg) by the end of the pregnancy.  You may begin to get stretch marks on your hips, abdomen, and breasts.  You may urinate more often because the fetus is moving lower into your pelvis and pressing on your bladder.  You may develop or continue to have heartburn. This is caused by increased hormones that slow down muscles in the digestive tract.  You may develop or continue to have constipation because increased hormones slow digestion and cause the muscles that push waste through your intestines to relax.  You may develop hemorrhoids. These are swollen veins (varicose veins) in the rectum that can itch or be painful.  You may develop swollen, bulging veins (varicose veins) in your legs.  You may have increased body aches in the pelvis, back, or thighs. This is due to weight gain and increased hormones that are relaxing your joints.  You may have changes in your hair. These can include thickening of your hair, rapid growth, and changes in texture. Some women also have hair loss during or after pregnancy, or hair that feels dry or thin. Your hair will most likely return to normal after your baby is born.  Your breasts will continue to grow and they will continue to become tender. A yellow fluid (colostrum) may leak from your breasts. This is the first milk you are producing for your baby.  Your belly button may stick out.  You may notice more swelling in your hands,  face, or ankles.  You may have increased tingling or numbness in your hands, arms, and legs. The skin on your belly may also feel numb.  You may feel short of breath because of your expanding uterus.  You may have more problems sleeping. This can be caused by the size of your belly, increased need to urinate, and an increase in your body's metabolism.  You may notice the fetus "dropping," or moving lower in your abdomen (lightening).  You may have increased vaginal discharge.  You may notice your joints feel loose and you may have pain around your pelvic bone.  What to expect at prenatal visits You will have prenatal exams every 2 weeks until week 36. Then you will have weekly prenatal exams. During a routine prenatal visit:  You will be weighed to make sure you and the baby are growing normally.  Your blood pressure will be taken.  Your abdomen will be measured to track your baby's growth.  The fetal heartbeat will be listened to.  Any test results from the previous visit will be discussed.  You may have a cervical check near your due date to see if your cervix has softened or thinned (effaced).  You will be tested for Group B streptococcus. This happens between 35 and 37 weeks.  Your health care provider may ask you:  What your birth plan is.  How you are feeling.  If you are feeling the baby move.  If you have had   any abnormal symptoms, such as leaking fluid, bleeding, severe headaches, or abdominal cramping.  If you are using any tobacco products, including cigarettes, chewing tobacco, and electronic cigarettes.  If you have any questions.  Other tests or screenings that may be performed during your third trimester include:  Blood tests that check for low iron levels (anemia).  Fetal testing to check the health, activity level, and growth of the fetus. Testing is done if you have certain medical conditions or if there are problems during the  pregnancy.  Nonstress test (NST). This test checks the health of your baby to make sure there are no signs of problems, such as the baby not getting enough oxygen. During this test, a belt is placed around your belly. The baby is made to move, and its heart rate is monitored during movement.  What is false labor? False labor is a condition in which you feel small, irregular tightenings of the muscles in the womb (contractions) that usually go away with rest, changing position, or drinking water. These are called Braxton Hicks contractions. Contractions may last for hours, days, or even weeks before true labor sets in. If contractions come at regular intervals, become more frequent, increase in intensity, or become painful, you should see your health care provider. What are the signs of labor?  Abdominal cramps.  Regular contractions that start at 10 minutes apart and become stronger and more frequent with time.  Contractions that start on the top of the uterus and spread down to the lower abdomen and back.  Increased pelvic pressure and dull back pain.  A watery or bloody mucus discharge that comes from the vagina.  Leaking of amniotic fluid. This is also known as your "water breaking." It could be a slow trickle or a gush. Let your health care provider know if it has a color or strange odor. If you have any of these signs, call your health care provider right away, even if it is before your due date. Follow these instructions at home: Medicines  Follow your health care provider's instructions regarding medicine use. Specific medicines may be either safe or unsafe to take during pregnancy.  Take a prenatal vitamin that contains at least 600 micrograms (mcg) of folic acid.  If you develop constipation, try taking a stool softener if your health care provider approves. Eating and drinking  Eat a balanced diet that includes fresh fruits and vegetables, whole grains, good sources of protein  such as meat, eggs, or tofu, and low-fat dairy. Your health care provider will help you determine the amount of weight gain that is right for you.  Avoid raw meat and uncooked cheese. These carry germs that can cause birth defects in the baby.  If you have low calcium intake from food, talk to your health care provider about whether you should take a daily calcium supplement.  Eat four or five small meals rather than three large meals a day.  Limit foods that are high in fat and processed sugars, such as fried and sweet foods.  To prevent constipation: ? Drink enough fluid to keep your urine clear or pale yellow. ? Eat foods that are high in fiber, such as fresh fruits and vegetables, whole grains, and beans. Activity  Exercise only as directed by your health care provider. Most women can continue their usual exercise routine during pregnancy. Try to exercise for 30 minutes at least 5 days a week. Stop exercising if you experience uterine contractions.  Avoid heavy   lifting.  Do not exercise in extreme heat or humidity, or at high altitudes.  Wear low-heel, comfortable shoes.  Practice good posture.  You may continue to have sex unless your health care provider tells you otherwise. Relieving pain and discomfort  Take frequent breaks and rest with your legs elevated if you have leg cramps or low back pain.  Take warm sitz baths to soothe any pain or discomfort caused by hemorrhoids. Use hemorrhoid cream if your health care provider approves.  Wear a good support bra to prevent discomfort from breast tenderness.  If you develop varicose veins: ? Wear support pantyhose or compression stockings as told by your healthcare provider. ? Elevate your feet for 15 minutes, 3-4 times a day. Prenatal care  Write down your questions. Take them to your prenatal visits.  Keep all your prenatal visits as told by your health care provider. This is important. Safety  Wear your seat belt at  all times when driving.  Make a list of emergency phone numbers, including numbers for family, friends, the hospital, and police and fire departments. General instructions  Avoid cat litter boxes and soil used by cats. These carry germs that can cause birth defects in the baby. If you have a cat, ask someone to clean the litter box for you.  Do not travel far distances unless it is absolutely necessary and only with the approval of your health care provider.  Do not use hot tubs, steam rooms, or saunas.  Do not drink alcohol.  Do not use any products that contain nicotine or tobacco, such as cigarettes and e-cigarettes. If you need help quitting, ask your health care provider.  Do not use any medicinal herbs or unprescribed drugs. These chemicals affect the formation and growth of the baby.  Do not douche or use tampons or scented sanitary pads.  Do not cross your legs for long periods of time.  To prepare for the arrival of your baby: ? Take prenatal classes to understand, practice, and ask questions about labor and delivery. ? Make a trial run to the hospital. ? Visit the hospital and tour the maternity area. ? Arrange for maternity or paternity leave through employers. ? Arrange for family and friends to take care of pets while you are in the hospital. ? Purchase a rear-facing car seat and make sure you know how to install it in your car. ? Pack your hospital bag. ? Prepare the baby's nursery. Make sure to remove all pillows and stuffed animals from the baby's crib to prevent suffocation.  Visit your dentist if you have not gone during your pregnancy. Use a soft toothbrush to brush your teeth and be gentle when you floss. Contact a health care provider if:  You are unsure if you are in labor or if your water has broken.  You become dizzy.  You have mild pelvic cramps, pelvic pressure, or nagging pain in your abdominal area.  You have lower back pain.  You have persistent  nausea, vomiting, or diarrhea.  You have an unusual or bad smelling vaginal discharge.  You have pain when you urinate. Get help right away if:  Your water breaks before 37 weeks.  You have regular contractions less than 5 minutes apart before 37 weeks.  You have a fever.  You are leaking fluid from your vagina.  You have spotting or bleeding from your vagina.  You have severe abdominal pain or cramping.  You have rapid weight loss or weight gain.    You have shortness of breath with chest pain.  You notice sudden or extreme swelling of your face, hands, ankles, feet, or legs.  Your baby makes fewer than 10 movements in 2 hours.  You have severe headaches that do not go away when you take medicine.  You have vision changes. Summary  The third trimester is from week 28 through week 40, months 7 through 9. The third trimester is a time when the unborn baby (fetus) is growing rapidly.  During the third trimester, your discomfort may increase as you and your baby continue to gain weight. You may have abdominal, leg, and back pain, sleeping problems, and an increased need to urinate.  During the third trimester your breasts will keep growing and they will continue to become tender. A yellow fluid (colostrum) may leak from your breasts. This is the first milk you are producing for your baby.  False labor is a condition in which you feel small, irregular tightenings of the muscles in the womb (contractions) that eventually go away. These are called Braxton Hicks contractions. Contractions may last for hours, days, or even weeks before true labor sets in.  Signs of labor can include: abdominal cramps; regular contractions that start at 10 minutes apart and become stronger and more frequent with time; watery or bloody mucus discharge that comes from the vagina; increased pelvic pressure and dull back pain; and leaking of amniotic fluid. This information is not intended to replace advice  given to you by your health care provider. Make sure you discuss any questions you have with your health care provider. Document Released: 01/12/2001 Document Revised: 06/26/2015 Document Reviewed: 03/21/2012 Elsevier Interactive Patient Education  2017 [employer]  

## 2016-04-26 ENCOUNTER — Ambulatory Visit (INDEPENDENT_AMBULATORY_CARE_PROVIDER_SITE_OTHER): Payer: BLUE CROSS/BLUE SHIELD | Admitting: Advanced Practice Midwife

## 2016-04-26 VITALS — BP 120/81 | HR 101 | Wt 181.0 lb

## 2016-04-26 DIAGNOSIS — Z3483 Encounter for supervision of other normal pregnancy, third trimester: Secondary | ICD-10-CM

## 2016-04-26 DIAGNOSIS — Z3480 Encounter for supervision of other normal pregnancy, unspecified trimester: Secondary | ICD-10-CM

## 2016-04-26 NOTE — Progress Notes (Signed)
   PRENATAL VISIT NOTE  Subjective:  Kathleen Morrison is a 31 y.o. G2P1001 at 5372w6d being seen today for ongoing prenatal care.  She is currently monitored for the following issues for this low-risk pregnancy and has Supervision of other normal pregnancy, antepartum; Late prenatal care affecting pregnancy, antepartum; Gastroesophageal reflux disease with esophagitis; and GBS (group B Streptococcus carrier), +RV culture, currently pregnant on her problem list.  Patient reports irregular cramping/contractions keeping her awake at night, came out of work last week r/t discomfort and stressful job.  Contractions: Irritability. Vag. Bleeding: None.  Movement: Present. Denies leaking of fluid.   The following portions of the patient's history were reviewed and updated as appropriate: allergies, current medications, past family history, past medical history, past social history, past surgical history and problem list. Problem list updated.  Objective:   Vitals:   04/26/16 0842  BP: 120/81  Pulse: (!) 101  Weight: 181 lb (82.1 kg)    Fetal Status: Fetal Heart Rate (bpm): 138 Fundal Height: 37 cm Movement: Present     General:  Alert, oriented and cooperative. Patient is in no acute distress.  Skin: Skin is warm and dry. No rash noted.   Cardiovascular: Normal heart rate noted  Respiratory: Normal respiratory effort, no problems with respiration noted  Abdomen: Soft, gravid, appropriate for gestational age. Pain/Pressure: Present     Pelvic:  Cervical exam deferred        Extremities: Normal range of motion.  Edema: None  Mental Status: Normal mood and affect. Normal behavior. Normal judgment and thought content.   Assessment and Plan:  Pregnancy: G2P1001 at 7272w6d  1. Supervision of other normal pregnancy, antepartum --Waterbirth consent/research study signed today.  Reviewed signs of labor/reasons to come to [facility].  Term labor symptoms and general obstetric precautions including but not  limited to vaginal bleeding, contractions, leaking of fluid and fetal movement were reviewed in detail with the patient. Please refer to After Visit Summary for other counseling recommendations.  Return in about 1 week (around 05/03/2016).   Hurshel PartyLisa A Leftwich-Kirby, CNM

## 2016-04-26 NOTE — Patient Instructions (Signed)

## 2016-05-03 ENCOUNTER — Ambulatory Visit (INDEPENDENT_AMBULATORY_CARE_PROVIDER_SITE_OTHER): Payer: BLUE CROSS/BLUE SHIELD | Admitting: Advanced Practice Midwife

## 2016-05-03 VITALS — BP 127/87 | HR 94 | Wt 184.0 lb

## 2016-05-03 DIAGNOSIS — Z3480 Encounter for supervision of other normal pregnancy, unspecified trimester: Secondary | ICD-10-CM

## 2016-05-03 DIAGNOSIS — Z3483 Encounter for supervision of other normal pregnancy, third trimester: Secondary | ICD-10-CM

## 2016-05-03 NOTE — Patient Instructions (Signed)
Third Trimester of Pregnancy The third trimester is from week 28 through week 40 (months 7 through 9). The third trimester is a time when the unborn baby (fetus) is growing rapidly. At the end of the ninth month, the fetus is about 20 inches in length and weighs 6-10 pounds. Body changes during your third trimester Your body will continue to go through many changes during pregnancy. The changes vary from woman to woman. During the third trimester:  Your weight will continue to increase. You can expect to gain 25-35 pounds (11-16 kg) by the end of the pregnancy.  You may begin to get stretch marks on your hips, abdomen, and breasts.  You may urinate more often because the fetus is moving lower into your pelvis and pressing on your bladder.  You may develop or continue to have heartburn. This is caused by increased hormones that slow down muscles in the digestive tract.  You may develop or continue to have constipation because increased hormones slow digestion and cause the muscles that push waste through your intestines to relax.  You may develop hemorrhoids. These are swollen veins (varicose veins) in the rectum that can itch or be painful.  You may develop swollen, bulging veins (varicose veins) in your legs.  You may have increased body aches in the pelvis, back, or thighs. This is due to weight gain and increased hormones that are relaxing your joints.  You may have changes in your hair. These can include thickening of your hair, rapid growth, and changes in texture. Some women also have hair loss during or after pregnancy, or hair that feels dry or thin. Your hair will most likely return to normal after your baby is born.  Your breasts will continue to grow and they will continue to become tender. A yellow fluid (colostrum) may leak from your breasts. This is the first milk you are producing for your baby.  Your belly button may stick out.  You may notice more swelling in your hands,  face, or ankles.  You may have increased tingling or numbness in your hands, arms, and legs. The skin on your belly may also feel numb.  You may feel short of breath because of your expanding uterus.  You may have more problems sleeping. This can be caused by the size of your belly, increased need to urinate, and an increase in your body's metabolism.  You may notice the fetus "dropping," or moving lower in your abdomen (lightening).  You may have increased vaginal discharge.  You may notice your joints feel loose and you may have pain around your pelvic bone.  What to expect at prenatal visits You will have prenatal exams every 2 weeks until week 36. Then you will have weekly prenatal exams. During a routine prenatal visit:  You will be weighed to make sure you and the baby are growing normally.  Your blood pressure will be taken.  Your abdomen will be measured to track your baby's growth.  The fetal heartbeat will be listened to.  Any test results from the previous visit will be discussed.  You may have a cervical check near your due date to see if your cervix has softened or thinned (effaced).  You will be tested for Group B streptococcus. This happens between 35 and 37 weeks.  Your health care provider may ask you:  What your birth plan is.  How you are feeling.  If you are feeling the baby move.  If you have had   any abnormal symptoms, such as leaking fluid, bleeding, severe headaches, or abdominal cramping.  If you are using any tobacco products, including cigarettes, chewing tobacco, and electronic cigarettes.  If you have any questions.  Other tests or screenings that may be performed during your third trimester include:  Blood tests that check for low iron levels (anemia).  Fetal testing to check the health, activity level, and growth of the fetus. Testing is done if you have certain medical conditions or if there are problems during the  pregnancy.  Nonstress test (NST). This test checks the health of your baby to make sure there are no signs of problems, such as the baby not getting enough oxygen. During this test, a belt is placed around your belly. The baby is made to move, and its heart rate is monitored during movement.  What is false labor? False labor is a condition in which you feel small, irregular tightenings of the muscles in the womb (contractions) that usually go away with rest, changing position, or drinking water. These are called Braxton Hicks contractions. Contractions may last for hours, days, or even weeks before true labor sets in. If contractions come at regular intervals, become more frequent, increase in intensity, or become painful, you should see your health care provider. What are the signs of labor?  Abdominal cramps.  Regular contractions that start at 10 minutes apart and become stronger and more frequent with time.  Contractions that start on the top of the uterus and spread down to the lower abdomen and back.  Increased pelvic pressure and dull back pain.  A watery or bloody mucus discharge that comes from the vagina.  Leaking of amniotic fluid. This is also known as your "water breaking." It could be a slow trickle or a gush. Let your health care provider know if it has a color or strange odor. If you have any of these signs, call your health care provider right away, even if it is before your due date. Follow these instructions at home: Medicines  Follow your health care provider's instructions regarding medicine use. Specific medicines may be either safe or unsafe to take during pregnancy.  Take a prenatal vitamin that contains at least 600 micrograms (mcg) of folic acid.  If you develop constipation, try taking a stool softener if your health care provider approves. Eating and drinking  Eat a balanced diet that includes fresh fruits and vegetables, whole grains, good sources of protein  such as meat, eggs, or tofu, and low-fat dairy. Your health care provider will help you determine the amount of weight gain that is right for you.  Avoid raw meat and uncooked cheese. These carry germs that can cause birth defects in the baby.  If you have low calcium intake from food, talk to your health care provider about whether you should take a daily calcium supplement.  Eat four or five small meals rather than three large meals a day.  Limit foods that are high in fat and processed sugars, such as fried and sweet foods.  To prevent constipation: ? Drink enough fluid to keep your urine clear or pale yellow. ? Eat foods that are high in fiber, such as fresh fruits and vegetables, whole grains, and beans. Activity  Exercise only as directed by your health care provider. Most women can continue their usual exercise routine during pregnancy. Try to exercise for 30 minutes at least 5 days a week. Stop exercising if you experience uterine contractions.  Avoid heavy   lifting.  Do not exercise in extreme heat or humidity, or at high altitudes.  Wear low-heel, comfortable shoes.  Practice good posture.  You may continue to have sex unless your health care provider tells you otherwise. Relieving pain and discomfort  Take frequent breaks and rest with your legs elevated if you have leg cramps or low back pain.  Take warm sitz baths to soothe any pain or discomfort caused by hemorrhoids. Use hemorrhoid cream if your health care provider approves.  Wear a good support bra to prevent discomfort from breast tenderness.  If you develop varicose veins: ? Wear support pantyhose or compression stockings as told by your healthcare provider. ? Elevate your feet for 15 minutes, 3-4 times a day. Prenatal care  Write down your questions. Take them to your prenatal visits.  Keep all your prenatal visits as told by your health care provider. This is important. Safety  Wear your seat belt at  all times when driving.  Make a list of emergency phone numbers, including numbers for family, friends, the hospital, and police and fire departments. General instructions  Avoid cat litter boxes and soil used by cats. These carry germs that can cause birth defects in the baby. If you have a cat, ask someone to clean the litter box for you.  Do not travel far distances unless it is absolutely necessary and only with the approval of your health care provider.  Do not use hot tubs, steam rooms, or saunas.  Do not drink alcohol.  Do not use any products that contain nicotine or tobacco, such as cigarettes and e-cigarettes. If you need help quitting, ask your health care provider.  Do not use any medicinal herbs or unprescribed drugs. These chemicals affect the formation and growth of the baby.  Do not douche or use tampons or scented sanitary pads.  Do not cross your legs for long periods of time.  To prepare for the arrival of your baby: ? Take prenatal classes to understand, practice, and ask questions about labor and delivery. ? Make a trial run to the hospital. ? Visit the hospital and tour the maternity area. ? Arrange for maternity or paternity leave through employers. ? Arrange for family and friends to take care of pets while you are in the hospital. ? Purchase a rear-facing car seat and make sure you know how to install it in your car. ? Pack your hospital bag. ? Prepare the baby's nursery. Make sure to remove all pillows and stuffed animals from the baby's crib to prevent suffocation.  Visit your dentist if you have not gone during your pregnancy. Use a soft toothbrush to brush your teeth and be gentle when you floss. Contact a health care provider if:  You are unsure if you are in labor or if your water has broken.  You become dizzy.  You have mild pelvic cramps, pelvic pressure, or nagging pain in your abdominal area.  You have lower back pain.  You have persistent  nausea, vomiting, or diarrhea.  You have an unusual or bad smelling vaginal discharge.  You have pain when you urinate. Get help right away if:  Your water breaks before 37 weeks.  You have regular contractions less than 5 minutes apart before 37 weeks.  You have a fever.  You are leaking fluid from your vagina.  You have spotting or bleeding from your vagina.  You have severe abdominal pain or cramping.  You have rapid weight loss or weight gain.    You have shortness of breath with chest pain.  You notice sudden or extreme swelling of your face, hands, ankles, feet, or legs.  Your baby makes fewer than 10 movements in 2 hours.  You have severe headaches that do not go away when you take medicine.  You have vision changes. Summary  The third trimester is from week 28 through week 40, months 7 through 9. The third trimester is a time when the unborn baby (fetus) is growing rapidly.  During the third trimester, your discomfort may increase as you and your baby continue to gain weight. You may have abdominal, leg, and back pain, sleeping problems, and an increased need to urinate.  During the third trimester your breasts will keep growing and they will continue to become tender. A yellow fluid (colostrum) may leak from your breasts. This is the first milk you are producing for your baby.  False labor is a condition in which you feel small, irregular tightenings of the muscles in the womb (contractions) that eventually go away. These are called Braxton Hicks contractions. Contractions may last for hours, days, or even weeks before true labor sets in.  Signs of labor can include: abdominal cramps; regular contractions that start at 10 minutes apart and become stronger and more frequent with time; watery or bloody mucus discharge that comes from the vagina; increased pelvic pressure and dull back pain; and leaking of amniotic fluid. This information is not intended to replace advice  given to you by your health care provider. Make sure you discuss any questions you have with your health care provider. Document Released: 01/12/2001 Document Revised: 06/26/2015 Document Reviewed: 03/21/2012 Elsevier Interactive Patient Education  2017 [employer]  

## 2016-05-03 NOTE — Progress Notes (Signed)
   PRENATAL VISIT NOTE  Subjective:  Kathleen Morrison is a 31 y.o. G2P1001 at [redacted]w[redacted]d being seen today for ongoing prenatal care.  She is currently monitored for the following issues for this low-risk pregnancy and has Supervision of other normal pregnancy, antepartum; Late prenatal care affecting pregnancy, antepartum; Gastroesophageal reflux disease with esophagitis; and GBS (group B Streptococcus carrier), +RV culture, currently pregnant on her problem list.  Patient reports no complaints.  Contractions: Not present. Vag. Bleeding: None.  Movement: Present. Denies leaking of fluid.   The following portions of the patient's history were reviewed and updated as appropriate: allergies, current medications, past family history, past medical history, past social history, past surgical history and problem list. Problem list updated.  Objective:   Vitals:   05/03/16 0839  BP: 127/87  Pulse: 94  Weight: 184 lb (83.5 kg)    Fetal Status:     Movement: Present     General:  Alert, oriented and cooperative. Patient is in no acute distress.  Skin: Skin is warm and dry. No rash noted.   Cardiovascular: Normal heart rate noted  Respiratory: Normal respiratory effort, no problems with respiration noted  Abdomen: Soft, gravid, appropriate for gestational age. Pain/Pressure: Present     Pelvic:  Cervical exam deferred        Extremities: Normal range of motion.  Edema: Trace  Mental Status: Normal mood and affect. Normal behavior. Normal judgment and thought content.   Assessment and Plan:  Pregnancy: G2P1001 at [redacted]w[redacted]d  There are no diagnoses linked to this encounter. Term labor symptoms and general obstetric precautions including but not limited to vaginal bleeding, contractions, leaking of fluid and fetal movement were reviewed in detail with the patient. Please refer to After Visit Summary for other counseling recommendations.  RTO 1 wk  Aviva Signs, CNM

## 2016-05-05 ENCOUNTER — Inpatient Hospital Stay (HOSPITAL_COMMUNITY)
Admission: AD | Admit: 2016-05-05 | Discharge: 2016-05-08 | DRG: 775 | Disposition: A | Discharge status: Home / Self Care | Payer: BLUE CROSS/BLUE SHIELD | Source: Ambulatory Visit | Attending: Obstetrics and Gynecology | Admitting: Obstetrics and Gynecology

## 2016-05-05 ENCOUNTER — Encounter (HOSPITAL_COMMUNITY): Payer: Self-pay | Admitting: *Deleted

## 2016-05-05 DIAGNOSIS — R030 Elevated blood-pressure reading, without diagnosis of hypertension: Secondary | ICD-10-CM | POA: Diagnosis present

## 2016-05-05 DIAGNOSIS — O26893 Other specified pregnancy related conditions, third trimester: Secondary | ICD-10-CM | POA: Diagnosis present

## 2016-05-05 DIAGNOSIS — O4292 Full-term premature rupture of membranes, unspecified as to length of time between rupture and onset of labor: Principal | ICD-10-CM | POA: Diagnosis present

## 2016-05-05 DIAGNOSIS — O99824 Streptococcus B carrier state complicating childbirth: Secondary | ICD-10-CM | POA: Diagnosis present

## 2016-05-05 DIAGNOSIS — IMO0002 Reserved for concepts with insufficient information to code with codable children: Secondary | ICD-10-CM | POA: Diagnosis present

## 2016-05-05 DIAGNOSIS — O0930 Supervision of pregnancy with insufficient antenatal care, unspecified trimester: Secondary | ICD-10-CM

## 2016-05-05 DIAGNOSIS — Z3A40 40 weeks gestation of pregnancy: Secondary | ICD-10-CM

## 2016-05-05 DIAGNOSIS — Z3480 Encounter for supervision of other normal pregnancy, unspecified trimester: Secondary | ICD-10-CM

## 2016-05-05 LAB — URINALYSIS, ROUTINE W REFLEX MICROSCOPIC
Bilirubin Urine: NEGATIVE
Glucose, UA: NEGATIVE mg/dL
Hgb urine dipstick: NEGATIVE
Ketones, ur: 5 mg/dL — AB
Leukocytes, UA: NEGATIVE
Nitrite: NEGATIVE
PH: 7 (ref 5.0–8.0)
Protein, ur: NEGATIVE mg/dL
SPECIFIC GRAVITY, URINE: 1.02 (ref 1.005–1.030)

## 2016-05-05 LAB — AMNISURE RUPTURE OF MEMBRANE (ROM) NOT AT ARMC: Amnisure ROM: POSITIVE

## 2016-05-05 NOTE — [facility] Note (Signed)
Pt presents with contractions and ?leaking fluid since 1820

## 2016-05-05 NOTE — H&P (Signed)
Kathleen Morrison is a 31 y.o. female presenting for RUPTURE OF MEMBRANES AND CONTRACTIONS. OB History    Gravida Para Term Preterm AB Living   SAB TAB Ectopic Multiple Live Births           1      Obstetric Comments   Linden Dolin ([place])     Past Medical History:  Diagnosis Date  . Anemia   . Vaginal Pap smear, abnormal    History reviewed. No pertinent surgical history. Family History: family history includes Anxiety disorder in her sister; Autism in her brother; Cerebral palsy in her brother; Diabetes in her mother. Social History:  reports that she has never smoked. She has never used smokeless tobacco. She reports that she drinks alcohol. She reports that she does not use drugs.     Maternal Diabetes: No Genetic Screening: Declined Maternal Ultrasounds/Referrals: Normal Fetal Ultrasounds or other Referrals:  None Maternal Substance Abuse:  No Significant Maternal Medications:  None Significant Maternal Lab Results:  None Other Comments:  None  Review of Systems  All other systems reviewed and are negative.  Maternal Medical History:  Reason for admission: Rupture of membranes.   Contractions: Onset was 1-2 hours ago.   Frequency: irregular.   Duration is approximately 30 seconds.   Perceived severity is mild.    Fetal activity: Perceived fetal activity is normal.   Last perceived fetal movement was within the past hour.    Prenatal Complications - Diabetes: none.    Dilation: 3 Effacement (%): 90 Station: -2 Exam by:: Hogan,CNM Blood pressure 133/89, pulse (!) 102, temperature 98.5 F (36.9 C), temperature source Oral, last menstrual period 07/29/2015, SpO2 100 %. Maternal Exam:  Uterine Assessment: Contraction strength is mild.  Contraction frequency is irregular.   Abdomen: Patient reports no abdominal tenderness. Fetal presentation: vertex  Introitus: Normal vulva. Normal vagina.  Ferning test: negative.  Nitrazine test:  negative. Amniotic fluid character: clear.  Pelvis: adequate for delivery.   Cervix: Cervix evaluated by sterile speculum exam and digital exam.   +amnisure   Fetal Exam Fetal Monitor Review: Mode: ultrasound.   Baseline rate: 155.  Variability: moderate (6-25 bpm).   Pattern: accelerations present and no decelerations.    Fetal State Assessment: Category I - tracings are normal.     Physical Exam  Nursing note and vitals reviewed. Constitutional: She is oriented to person, place, and time. She appears well-developed and well-nourished. No distress.  HENT:  Head: Normocephalic.  Cardiovascular: Normal rate.   Respiratory: Effort normal.  GI: Soft. There is no tenderness. There is no rebound.  Genitourinary:  Genitourinary Comments:  External: no lesion Vagina: large amount of clear watery mucous  Cervix: pink, smooth, 3/90/-2/vtx Uterus: AGA   Neurological: She is alert and oriented to person, place, and time.  Skin: Skin is warm and dry.  Psychiatric: She has a normal mood and affect.    Prenatal labs: ABO, Rh: B/POS/-- (12/11 0921) Antibody: NEG (12/11 0921) Rubella: 2.35 (12/11 0921) RPR: NON REAC (01/12 1610)  HBsAg: NEGATIVE (12/11 9604)  HIV: NONREACTIVE (01/12 0938)  GBS:   POSITIVE   Assessment/Plan: PROM Elevated blood pressure today, no symptoms, labs pending Desires waterbirth GBS+: PCN for labor    Tawnya Crook 05/05/2016, 11:03 PM

## 2016-05-06 ENCOUNTER — Encounter (HOSPITAL_COMMUNITY): Payer: Self-pay | Admitting: *Deleted

## 2016-05-06 DIAGNOSIS — O99824 Streptococcus B carrier state complicating childbirth: Secondary | ICD-10-CM

## 2016-05-06 DIAGNOSIS — Z3A40 40 weeks gestation of pregnancy: Secondary | ICD-10-CM

## 2016-05-06 DIAGNOSIS — IMO0002 Reserved for concepts with insufficient information to code with codable children: Secondary | ICD-10-CM | POA: Diagnosis present

## 2016-05-06 LAB — COMPREHENSIVE METABOLIC PANEL
ALBUMIN: 3 g/dL — AB (ref 3.5–5.0)
ALT: 13 U/L — ABNORMAL LOW (ref 14–54)
ANION GAP: 10 (ref 5–15)
AST: 20 U/L (ref 15–41)
Alkaline Phosphatase: 74 U/L (ref 38–126)
BUN: 13 mg/dL (ref 6–20)
CO2: 21 mmol/L — AB (ref 22–32)
Calcium: 8.9 mg/dL (ref 8.9–10.3)
Chloride: 106 mmol/L (ref 101–111)
Creatinine, Ser: 0.49 mg/dL (ref 0.44–1.00)
GFR calc non Af Amer: >60 mL/min (ref >60)
GLUCOSE: 98 mg/dL (ref 65–99)
Potassium: 4.2 mmol/L (ref 3.5–5.1)
SODIUM: 137 mmol/L (ref 135–145)
TOTAL PROTEIN: 6.7 g/dL (ref 6.5–8.1)
Total Bilirubin: 0.3 mg/dL (ref 0.3–1.2)

## 2016-05-06 LAB — CBC
HCT: 33.7 % — ABNORMAL LOW (ref 36.0–46.0)
Hemoglobin: 10.6 g/dL — ABNORMAL LOW (ref 12.0–15.0)
MCH: 25.4 pg — ABNORMAL LOW (ref 26.0–34.0)
MCHC: 31.5 g/dL (ref 30.0–36.0)
MCV: 80.8 fL (ref 78.0–100.0)
Platelets: 221 10*3/uL (ref 150–400)
RBC: 4.17 MIL/uL (ref 3.87–5.11)
RDW: 16 % — AB (ref 11.5–15.5)
WBC: 6.4 10*3/uL (ref 4.0–10.5)

## 2016-05-06 LAB — TYPE AND SCREEN
ABO/RH(D): B POS
ANTIBODY SCREEN: NEGATIVE

## 2016-05-06 LAB — ABO/RH: ABO/RH(D): B POS

## 2016-05-06 LAB — PROTEIN / CREATININE RATIO, URINE
CREATININE, URINE: 87 mg/dL
PROTEIN CREATININE RATIO: 0.1 mg/mg{creat} (ref 0.00–0.15)
TOTAL PROTEIN, URINE: 9 mg/dL

## 2016-05-06 MED ORDER — LACTATED RINGERS IV SOLN: 500.0000 mL | INTRAVENOUS | Status: DC | PRN

## 2016-05-06 MED ORDER — WITCH HAZEL-GLYCERIN EX PADS: 1.0000 "application " | MEDICATED_PAD | CUTANEOUS | Status: DC | PRN

## 2016-05-06 MED ORDER — COCONUT OIL OIL: 1.0000 "application " | TOPICAL_OIL | Status: DC | PRN

## 2016-05-06 MED ORDER — SOD CITRATE-CITRIC ACID 500-334 MG/5ML PO SOLN: 30.0000 mL | ORAL | Status: DC | PRN

## 2016-05-06 MED ORDER — PENICILLIN G POTASSIUM 5000000 UNITS IJ SOLR
5.0000 10*6.[IU] | Freq: Once | INTRAVENOUS | Status: DC
  Filled 2016-05-06: qty 5

## 2016-05-06 MED ORDER — CALCIUM CARBONATE ANTACID 500 MG PO CHEW
1.0000 | CHEWABLE_TABLET | Freq: Every day | ORAL | Status: DC
  Administered 2016-05-06 – 2016-05-08 (×3): 200 mg via ORAL
  Filled 2016-05-06 (×3): qty 1

## 2016-05-06 MED ORDER — SENNOSIDES-DOCUSATE SODIUM 8.6-50 MG PO TABS
2.0000 | ORAL_TABLET | ORAL | Status: DC
  Filled 2016-05-06 (×2): qty 2

## 2016-05-06 MED ORDER — ACETAMINOPHEN 325 MG PO TABS
650.0000 mg | ORAL_TABLET | ORAL | Status: DC | PRN
Start: 2016-05-06 — End: 2016-05-06

## 2016-05-06 MED ORDER — PRENATAL MULTIVITAMIN CH
1.0000 | ORAL_TABLET | Freq: Every day | ORAL | Status: DC
  Filled 2016-05-06 (×2): qty 1

## 2016-05-06 MED ORDER — ACETAMINOPHEN 325 MG PO TABS
650.0000 mg | ORAL_TABLET | ORAL | Status: DC | PRN
  Administered 2016-05-07: 650 mg via ORAL
  Filled 2016-05-06: qty 2

## 2016-05-06 MED ORDER — NALBUPHINE HCL 10 MG/ML IJ SOLN: 5.0000 mg | INTRAMUSCULAR | Status: DC | PRN

## 2016-05-06 MED ORDER — DIBUCAINE 1 % RE OINT: 1.0000 "application " | TOPICAL_OINTMENT | RECTAL | Status: DC | PRN

## 2016-05-06 MED ORDER — BENZOCAINE-MENTHOL 20-0.5 % EX AERO
1.0000 "application " | INHALATION_SPRAY | CUTANEOUS | Status: DC | PRN
  Filled 2016-05-06: qty 56

## 2016-05-06 MED ORDER — ONDANSETRON HCL 4 MG PO TABS: 4.0000 mg | ORAL_TABLET | ORAL | Status: DC | PRN

## 2016-05-06 MED ORDER — LIDOCAINE HCL (PF) 1 % IJ SOLN
30.0000 mL | INTRAMUSCULAR | Status: DC | PRN
  Filled 2016-05-06: qty 30

## 2016-05-06 MED ORDER — TETANUS-DIPHTH-ACELL PERTUSSIS 5-2.5-18.5 LF-MCG/0.5 IM SUSP: 0.5000 mL | Freq: Once | INTRAMUSCULAR | Status: DC

## 2016-05-06 MED ORDER — LACTATED RINGERS IV SOLN: INTRAVENOUS | Status: DC

## 2016-05-06 MED ORDER — ZOLPIDEM TARTRATE 5 MG PO TABS: 5.0000 mg | ORAL_TABLET | Freq: Every evening | ORAL | Status: DC | PRN

## 2016-05-06 MED ORDER — ONDANSETRON HCL 4 MG/2ML IJ SOLN: 4.0000 mg | Freq: Four times a day (QID) | INTRAMUSCULAR | Status: DC | PRN

## 2016-05-06 MED ORDER — OXYTOCIN 40 UNITS IN LACTATED RINGERS INFUSION - SIMPLE MED
2.5000 [IU]/h | INTRAVENOUS | Status: DC
  Filled 2016-05-06: qty 1000

## 2016-05-06 MED ORDER — ONDANSETRON HCL 4 MG/2ML IJ SOLN: 4.0000 mg | INTRAMUSCULAR | Status: DC | PRN

## 2016-05-06 MED ORDER — SIMETHICONE 80 MG PO CHEW
80.0000 mg | CHEWABLE_TABLET | ORAL | Status: DC | PRN
  Filled 2016-05-06: qty 1

## 2016-05-06 MED ORDER — OXYCODONE-ACETAMINOPHEN 5-325 MG PO TABS: 2.0000 | ORAL_TABLET | ORAL | Status: DC | PRN

## 2016-05-06 MED ORDER — IBUPROFEN 600 MG PO TABS
600.0000 mg | ORAL_TABLET | Freq: Four times a day (QID) | ORAL | Status: DC
  Administered 2016-05-06 – 2016-05-07 (×7): 600 mg via ORAL
  Filled 2016-05-06 (×8): qty 1

## 2016-05-06 MED ORDER — PENICILLIN G POT IN DEXTROSE 60000 UNIT/ML IV SOLN
3.0000 10*6.[IU] | INTRAVENOUS | Status: DC
  Filled 2016-05-06 (×3): qty 50

## 2016-05-06 MED ORDER — DIPHENHYDRAMINE HCL 25 MG PO CAPS: 25.0000 mg | ORAL_CAPSULE | Freq: Four times a day (QID) | ORAL | Status: DC | PRN

## 2016-05-06 MED ORDER — OXYTOCIN BOLUS FROM INFUSION
500.0000 mL | Freq: Once | INTRAVENOUS | Status: DC
Start: 2016-05-06 — End: 2016-05-06

## 2016-05-06 MED ORDER — OXYCODONE-ACETAMINOPHEN 5-325 MG PO TABS: 1.0000 | ORAL_TABLET | ORAL | Status: DC | PRN

## 2016-05-06 MED ORDER — PANTOPRAZOLE SODIUM 20 MG PO TBEC
20.0000 mg | DELAYED_RELEASE_TABLET | Freq: Every day | ORAL | Status: DC
  Administered 2016-05-06 – 2016-05-08 (×3): 20 mg via ORAL
  Filled 2016-05-06 (×3): qty 1

## 2016-05-06 NOTE — Lactation Note (Signed)
This note was copied from a baby's chart. Lactation Consultation Note: Mother is an experienced breastfeeding mother, with having breastfed her first child for 2 yrs. Infant is 63 hours old and has breastfed 3-4 times. Mother has been breastfeeding infant with feeding cues. Father is doing skin to skin at present. Mother was give a lactation brochure with information on LC services and community support. Advised mother to continue to cue base feed and feed at least 8-12 times in 24 hours. Mother advised to page for [facility] as needed with any concerns or difficulties.   Patient Name: Kathleen Morrison [id number] Date: 05/06/2016 Reason for consult: Initial assessment   Maternal Data    Feeding    LATCH Score/Interventions                      Lactation Tools Discussed/Used     Consult Status Consult Status: Follow-up Date: 05/06/16 Follow-up type: In-patient    Stevan Born [facility] 05/06/2016, 3:20 PM

## 2016-05-07 LAB — RPR: RPR Ser Ql: NONREACTIVE

## 2016-05-07 NOTE — Lactation Note (Signed)
This note was copied from a baby's chart. Lactation Consultation Note  Patient Name: Kathleen Morrison [id number] Date: 05/07/2016 Reason for consult: Follow-up assessment Baby at 38 hr of life. Upon entry baby was sleeping and mom was resting. Mom reports baby is latching well. She denies breast or nipple pain, voiced no concerns. Parents are aware of lactation services and support group. They will call as needed.   Maternal Data    Feeding    LATCH Score/Interventions                      Lactation Tools Discussed/Used     Consult Status Consult Status: PRN    Rulon Eisenmenger 05/07/2016, 3:04 PM

## 2016-05-07 NOTE — Progress Notes (Signed)
Post Partum Day 1 1/2 Subjective: no complaints, up ad lib, voiding, tolerating PO and she would like to stay until tomorrow as baby will be staying until then  Objective: Blood pressure 110/70, pulse 91, temperature 98 F (36.7 C), temperature source Oral, resp. rate 18, height  (1.676 m), weight 184 lb (83.5 kg), last menstrual period 07/29/2015, SpO2 100 %, unknown if currently breastfeeding.  Physical Exam:  General: alert Lochia: appropriate Uterine Fundus: firm  DVT Evaluation: No evidence of DVT seen on physical exam.   Recent Labs  05/05/16 2345  HGB 10.6*  HCT 33.7*    Assessment/Plan: Plan for discharge tomorrow   LOS: 2 days   Allie Bossier 05/07/2016, 9:29 AM

## 2016-05-08 MED ORDER — IBUPROFEN 600 MG PO TABS: 600.0000 mg | ORAL_TABLET | Freq: Four times a day (QID) | ORAL | 0 refills | Status: DC

## 2016-05-08 MED ORDER — ACETAMINOPHEN 325 MG PO TABS: 650.0000 mg | ORAL_TABLET | ORAL | 0 refills | Status: DC | PRN

## 2016-05-08 NOTE — Discharge Summary (Signed)
OB Discharge Summary     Patient Name: Kathleen Morrison DOB: 12-19-1985 MRN: [id number]  Date of admission: 05/05/2016 Delivering MD: Thressa Sheller D   Date of discharge: 05/08/2016  Admitting diagnosis: 40w labor, 3cm, water birth Intrauterine pregnancy: [redacted]w[redacted]d     Secondary diagnosis:  Active Problems:   Rupture of membranes with clear amniotic fluid  Additional problems: none     Discharge diagnosis: Term Pregnancy Delivered                                                                                                Post partum procedures:none  Augmentation: none  Complications: None  Hospital course:  Onset of Labor With Vaginal Delivery     31 y.o. yo [id number] at [redacted]w[redacted]d was admitted in Latent Labor on 05/05/2016. Patient had an uncomplicated labor course as follows:  Membrane Rupture Time/Date: 6:20 PM ,05/05/2016   Intrapartum Procedures: Episiotomy: None [1]                                         Lacerations:  1st degree [2];Periurethral [8]  Patient had a delivery of a Viable infant. 05/06/2016  Information for the patient's newborn:  Semira, [place] [[id number]]  Delivery Method: Vaginal, Spontaneous Delivery (Filed from Delivery Summary)    Pateint had an uncomplicated postpartum course.  She is ambulating, tolerating a regular diet, passing flatus, and urinating well. Patient is discharged home in stable condition on 05/08/16.   Physical exam  Vitals:   05/06/16 1910 05/07/16 0625 05/07/16 1841 05/08/16 0543  BP:  110/70 125/75 124/80  Pulse: (!) 102 91 91 (!) 103  Resp:  Temp:  98 F (36.7 C) 98 F (36.7 C) 98.6 F (37 C)  TempSrc:  Oral Oral Oral  SpO2:    100%  Weight:      Height:       General: alert, cooperative and no distress Lochia: appropriate Uterine Fundus: firm Incision: N/A DVT Evaluation: No evidence of DVT seen on physical exam. Labs: Lab Results  Component Value Date   WBC 6.4 05/05/2016   HGB 10.6 (L) 05/05/2016    HCT 33.7 (L) 05/05/2016   MCV 80.8 05/05/2016   PLT 221 05/05/2016   CMP Latest Ref Rng & Units 05/05/2016  Glucose 65 - 99 mg/dL 98  BUN 6 - 20 mg/dL 13  Creatinine 1.30 - 8.65 mg/dL 7.84  Sodium 696 - 295 mmol/L 137  Potassium 3.5 - 5.1 mmol/L 4.2  Chloride 101 - 111 mmol/L 106  CO2 22 - 32 mmol/L 21(L)  Calcium 8.9 - 10.3 mg/dL 8.9  Total Protein 6.5 - 8.1 g/dL 6.7  Total Bilirubin 0.3 - 1.2 mg/dL 0.3  Alkaline Phos 38 - 126 U/L 74  AST 15 - 41 U/L 20  ALT 14 - 54 U/L 13(L)    Discharge instruction: per After Visit Summary and "Baby and Me Booklet".  After visit meds:  Allergies as of 05/08/2016  Reactions   Pineapple Rash      Medication List    TAKE these medications   acetaminophen 325 MG tablet Commonly known as:  TYLENOL Take 2 tablets (650 mg total) by mouth every 4 (four) hours as needed (for pain scale < 4).   CHILDRENS MULTIVITAMIN PO Take 1 tablet by mouth daily.   ibuprofen 600 MG tablet Commonly known as:  ADVIL,MOTRIN Take 1 tablet (600 mg total) by mouth every 6 (six) hours.   pantoprazole 20 MG tablet Commonly known as:  PROTONIX Take 1 tablet (20 mg total) by mouth daily.   TUMS 500 MG chewable tablet Generic drug:  calcium carbonate Chew 1 tablet by mouth daily.       Diet: routine diet  Activity: Advance as tolerated. Pelvic rest for 6 weeks.   Outpatient follow up:6 weeks Follow up Appt:No future appointments. Follow up Visit:No Follow-up on file.  Postpartum contraception: Vasectomy  Newborn Data: Live born female  Birth Weight: 6 lb 9.4 oz (2988 g) APGAR: 8, 9  Baby Feeding: Breast Disposition:home with mother   05/08/2016 Ernestina Penna, MD

## 2016-05-08 NOTE — Discharge Instructions (Signed)

## 2016-05-08 NOTE — Lactation Note (Signed)
This note was copied from a baby's chart. Lactation Consultation Note  Patient Name: Kathleen Morrison [id number] Date: 05/08/2016 Reason for consult: Follow-up assessment Mom reports baby is nursing well, her milk is in. Engorgement care discussed. Advised of support group. Mom to call for questions/concerns.   Maternal Data    Feeding Feeding Type: Breast Fed Length of feed: 15 min  LATCH Score/Interventions                      Lactation Tools Discussed/Used Tools: Pump Breast pump type: Double-Electric Breast Pump   Consult Status Consult Status: Complete Date: 05/08/16 Follow-up type: In-patient    Alfred Levins 05/08/2016, 12:16 PM

## 2016-05-24 ENCOUNTER — Encounter: Payer: Self-pay | Admitting: *Deleted

## 2016-06-14 ENCOUNTER — Ambulatory Visit (INDEPENDENT_AMBULATORY_CARE_PROVIDER_SITE_OTHER): Payer: BLUE CROSS/BLUE SHIELD | Admitting: Advanced Practice Midwife

## 2016-06-14 ENCOUNTER — Encounter: Payer: Self-pay | Admitting: Advanced Practice Midwife

## 2016-06-14 VITALS — BP 115/74 | HR 76 | Ht 65.0 in | Wt 164.0 lb

## 2016-06-14 DIAGNOSIS — K5900 Constipation, unspecified: Secondary | ICD-10-CM

## 2016-06-14 DIAGNOSIS — Z3009 Encounter for other general counseling and advice on contraception: Secondary | ICD-10-CM

## 2016-06-14 NOTE — Progress Notes (Signed)
Post Partum Exam  Kathleen PitmanChristina Klecker is a 31 y.o. 372P2002 female who presents for a postpartum visit. She is 6 weeks postpartum following a spontaneous vaginal delivery. I have fully reviewed the prenatal and intrapartum course. The delivery was at 40 gestational weeks.  Anesthesia: none. Postpartum course has been unremarkable. Baby's course has been unremarkable. Baby is feeding by breast. Bleeding thin lochia. Bowel function is abnormal: feeling constipated. Pt believes it has a lot to do with her diet. Also C/O oversupply of breast milk. Bladder function is normal. Patient is not sexually active. Contraception method is unsure. Postpartum depression screening:neg (score 3)  The following portions of the patient's history were reviewed and updated as appropriate: allergies, current medications, past family history, past medical history, past social history, past surgical history and problem list.  Last Pap 01/2016--Nml.  Review of Systems Pertinent items are noted in HPI.    Objective:    BP 116/78 mmHg  Pulse 78  Resp 16  Ht 5\' 5"  (1.651 m)  Wt 211 lb (95.709 kg)  BMI 35.11 kg/m2  Breastfeeding? Yes  General:  alert, cooperative, appears stated age and no distress   Breasts:  inspection negative, no nipple discharge or bleeding, no masses or nodularity palpable  Lungs: clear to auscultation bilaterally  Heart:  regular rate and rhythm, S1, S2 normal, no murmur, click, rub or gallop  Abdomen: soft, non-tender; bowel sounds normal; no masses,  no organomegaly   Vulva:  not evaluated  Rectal Exam: Not performed.        Assessment:   Nml postpartum exam. Pap smear not done at today's visit.   Constipation Increase fluids, fiber and physical activity.   Oversupply of breast milk - Decrease pumping after BF. Pump only enough for comfort.   Plan:   1. Contraception: condoms 2. Discussed LARC. Info given.  3. Follow up in: 1 year or as needed.

## 2016-06-15 NOTE — Patient Instructions (Signed)
Contraception Choices Contraception (birth control) is the use of any methods or devices to prevent pregnancy. Below are some methods to help avoid pregnancy. Hormonal methods  Contraceptive implant. This is a thin, plastic tube containing progesterone hormone. It does not contain estrogen hormone. Your health care provider inserts the tube in the inner part of the upper arm. The tube can remain in place for up to 3 years. After 3 years, the implant must be removed. The implant prevents the ovaries from releasing an egg (ovulation), thickens the cervical mucus to prevent sperm from entering the uterus, and thins the lining of the inside of the uterus.  Progesterone-only injections. These injections are given every 3 months by your health care provider to prevent pregnancy. This synthetic progesterone hormone stops the ovaries from releasing eggs. It also thickens cervical mucus and changes the uterine lining. This makes it harder for sperm to survive in the uterus.  Birth control pills. These pills contain estrogen and progesterone hormone. They work by preventing the ovaries from releasing eggs (ovulation). They also cause the cervical mucus to thicken, preventing the sperm from entering the uterus. Birth control pills are prescribed by a health care provider.Birth control pills can also be used to treat heavy periods.  Minipill. This type of birth control pill contains only the progesterone hormone. They are taken every day of each month and must be prescribed by your health care provider.  Birth control patch. The patch contains hormones similar to those in birth control pills. It must be changed once a week and is prescribed by a health care provider.  Vaginal ring. The ring contains hormones similar to those in birth control pills. It is left in the vagina for 3 weeks, removed for 1 week, and then a new one is put back in place. The patient must be comfortable inserting and removing the ring from  the vagina.A health care provider's prescription is necessary.  Emergency contraception. Emergency contraceptives prevent pregnancy after unprotected sexual intercourse. This pill can be taken right after sex or up to 5 days after unprotected sex. It is most effective the sooner you take the pills after having sexual intercourse. Most emergency contraceptive pills are available without a prescription. Check with your pharmacist. Do not use emergency contraception as your only form of birth control. Barrier methods  Female condom. This is a thin sheath (latex or rubber) that is worn over the penis during sexual intercourse. It can be used with spermicide to increase effectiveness.  Female condom. This is a soft, loose-fitting sheath that is put into the vagina before sexual intercourse.  Diaphragm. This is a soft, latex, dome-shaped barrier that must be fitted by a health care provider. It is inserted into the vagina, along with a spermicidal jelly. It is inserted before intercourse. The diaphragm should be left in the vagina for 6 to 8 hours after intercourse.  Cervical cap. This is a round, soft, latex or plastic cup that fits over the cervix and must be fitted by a health care provider. The cap can be left in place for up to 48 hours after intercourse.  Sponge. This is a soft, circular piece of polyurethane foam. The sponge has spermicide in it. It is inserted into the vagina after wetting it and before sexual intercourse.  Spermicides. These are chemicals that kill or block sperm from entering the cervix and uterus. They come in the form of creams, jellies, suppositories, foam, or tablets. They do not require a prescription. They   are inserted into the vagina with an applicator before having sexual intercourse. The process must be repeated every time you have sexual intercourse. Intrauterine contraception  Intrauterine device (IUD). This is a T-shaped device that is put in a woman's uterus during  a menstrual period to prevent pregnancy. There are 2 types: ? Copper IUD. This type of IUD is wrapped in copper wire and is placed inside the uterus. Copper makes the uterus and fallopian tubes produce a fluid that kills sperm. It can stay in place for 10 years. ? Hormone IUD. This type of IUD contains the hormone progestin (synthetic progesterone). The hormone thickens the cervical mucus and prevents sperm from entering the uterus, and it also thins the uterine lining to prevent implantation of a fertilized egg. The hormone can weaken or kill the sperm that get into the uterus. It can stay in place for 3-5 years, depending on which type of IUD is used. Permanent methods of contraception  Female tubal ligation. This is when the woman's fallopian tubes are surgically sealed, tied, or blocked to prevent the egg from traveling to the uterus.  Hysteroscopic sterilization. This involves placing a small coil or insert into each fallopian tube. Your doctor uses a technique called hysteroscopy to do the procedure. The device causes scar tissue to form. This results in permanent blockage of the fallopian tubes, so the sperm cannot fertilize the egg. It takes about 3 months after the procedure for the tubes to become blocked. You must use another form of birth control for these 3 months.  Female sterilization. This is when the female has the tubes that carry sperm tied off (vasectomy).This blocks sperm from entering the vagina during sexual intercourse. After the procedure, the man can still ejaculate fluid (semen). Natural planning methods  Natural family planning. This is not having sexual intercourse or using a barrier method (condom, diaphragm, cervical cap) on days the woman could become pregnant.  Calendar method. This is keeping track of the length of each menstrual cycle and identifying when you are fertile.  Ovulation method. This is avoiding sexual intercourse during ovulation.  Symptothermal method.  This is avoiding sexual intercourse during ovulation, using a thermometer and ovulation symptoms.  Post-ovulation method. This is timing sexual intercourse after you have ovulated. Regardless of which type or method of contraception you choose, it is important that you use condoms to protect against the transmission of sexually transmitted infections (STIs). Talk with your health care provider about which form of contraception is most appropriate for you. This information is not intended to replace advice given to you by your health care provider. Make sure you discuss any questions you have with your health care provider. Document Released: 01/18/2005 Document Revised: 06/26/2015 Document Reviewed: 07/13/2012 Elsevier Interactive Patient Education  2017 [employer]  

## 2017-04-08 ENCOUNTER — Ambulatory Visit (INDEPENDENT_AMBULATORY_CARE_PROVIDER_SITE_OTHER): Payer: 59

## 2017-04-08 VITALS — BP 115/85 | HR 113 | Ht 66.0 in | Wt 155.0 lb

## 2017-04-08 DIAGNOSIS — N760 Acute vaginitis: Secondary | ICD-10-CM

## 2017-04-08 DIAGNOSIS — N898 Other specified noninflammatory disorders of vagina: Secondary | ICD-10-CM | POA: Diagnosis not present

## 2017-04-08 DIAGNOSIS — B9689 Other specified bacterial agents as the cause of diseases classified elsewhere: Secondary | ICD-10-CM | POA: Diagnosis not present

## 2017-04-08 DIAGNOSIS — Z113 Encounter for screening for infections with a predominantly sexual mode of transmission: Secondary | ICD-10-CM

## 2017-04-08 MED ORDER — METRONIDAZOLE 500 MG PO TABS: 500.0000 mg | ORAL_TABLET | Freq: Two times a day (BID) | ORAL | 0 refills | Status: DC

## 2017-04-08 NOTE — Progress Notes (Signed)
History:  Ms. Kathleen Morrison is a 32 y.o. [id number] who presents to clinic today for vaginal discharge. She states after her period stopped, she noticed an increase in discharge with a foul odor. She also notes some spotting also. She is concerned she may be pregnant. She had negative tests at home but is requesting one here today. LMP 2/22  The following portions of the patient's history were reviewed and updated as appropriate: allergies, current medications, family history, past medical history, social history, past surgical history and problem list.  Review of Systems:  Review of Systems  Constitutional: Negative.  Negative for chills and fever.  Respiratory: Negative.   Cardiovascular: Negative.  Negative for chest pain.  Genitourinary: Negative.        Vaginal discharge   Neurological: Negative.  Negative for dizziness and headaches.      Objective:  Physical Exam BP 115/85   Pulse (!) 113   Ht 5\' 6"  (1.676 m)   Wt 155 lb (70.3 kg)   Breastfeeding? Yes   BMI 25.02 kg/m  Physical Exam  Constitutional: She is oriented to person, place, and time. She appears well-developed and well-nourished. No distress.  HENT:  Head: Normocephalic.  Eyes: Pupils are equal, round, and reactive to light.  Neck: Normal range of motion.  Cardiovascular: Normal rate and regular rhythm.  Pulmonary/Chest: Effort normal and breath sounds normal. No respiratory distress.  Abdominal: Soft. There is no tenderness.  Genitourinary: Vaginal discharge (Small amount of pink discharge) found.  Musculoskeletal: Normal range of motion.  Neurological: She is alert and oriented to person, place, and time.  Skin: Skin is warm and dry.  Psychiatric: She has a normal mood and affect. Her behavior is normal. Judgment and thought content normal.  Nursing note and vitals reviewed.   Assessment & Plan:  1. Vaginal discharge - Will check for everything but suspect bacterial vaginosis - Cervicovaginal ancillary  only -Negative UPT  2. BV (bacterial vaginosis) -Metronidazole sent to patient's pharmacy. Will treat based on history of frequent BV infections and symptoms.   Rolm Bookbindereill, Tiann Saha M, [place] 04/08/2017 11:28 AM

## 2017-04-08 NOTE — Patient Instructions (Addendum)
Etonogestrel implant What is this medicine? ETONOGESTREL (et oh noe JES trel) is a contraceptive (birth control) device. It is used to prevent pregnancy. It can be used for up to 3 years. This medicine may be used for other purposes; ask your health care provider or pharmacist if you have questions. COMMON BRAND NAME(S): Implanon, Nexplanon What should I tell my health care provider before I take this medicine? They need to know if you have any of these conditions: -abnormal vaginal bleeding -blood vessel disease or blood clots -cancer of the breast, cervix, or liver -depression -diabetes -gallbladder disease -headaches -heart disease or recent heart attack -high blood pressure -high cholesterol -kidney disease -liver disease -renal disease -seizures -tobacco smoker -an unusual or allergic reaction to etonogestrel, other hormones, anesthetics or antiseptics, medicines, foods, dyes, or preservatives -pregnant or trying to get pregnant -breast-feeding How should I use this medicine? This device is inserted just under the skin on the inner side of your upper arm by a health care professional. Talk to your pediatrician regarding the use of this medicine in children. Special care may be needed. Overdosage: If you think you have taken too much of this medicine contact a poison control center or emergency room at once. NOTE: This medicine is only for you. Do not share this medicine with others. What if I miss a dose? This does not apply. What may interact with this medicine? Do not take this medicine with any of the following medications: -amprenavir -bosentan -fosamprenavir This medicine may also interact with the following medications: -barbiturate medicines for inducing sleep or treating seizures -certain medicines for fungal infections like ketoconazole and itraconazole -grapefruit juice -griseofulvin -medicines to treat seizures like carbamazepine, felbamate, oxcarbazepine,  phenytoin, topiramate -modafinil -phenylbutazone -rifampin -rufinamide -some medicines to treat HIV infection like atazanavir, indinavir, lopinavir, nelfinavir, tipranavir, ritonavir -St. John's wort This list may not describe all possible interactions. Give your health care provider a list of all the medicines, herbs, non-prescription drugs, or dietary supplements you use. Also tell them if you smoke, drink alcohol, or use illegal drugs. Some items may interact with your medicine. What should I watch for while using this medicine? This product does not protect you against HIV infection (AIDS) or other sexually transmitted diseases. You should be able to feel the implant by pressing your fingertips over the skin where it was inserted. Contact your doctor if you cannot feel the implant, and use a non-hormonal birth control method (such as condoms) until your doctor confirms that the implant is in place. If you feel that the implant may have broken or become bent while in your arm, contact your healthcare provider. What side effects may I notice from receiving this medicine? Side effects that you should report to your doctor or health care professional as soon as possible: -allergic reactions like skin rash, itching or hives, swelling of the face, lips, or tongue -breast lumps -changes in emotions or moods -depressed mood -heavy or prolonged menstrual bleeding -pain, irritation, swelling, or bruising at the insertion site -scar at site of insertion -signs of infection at the insertion site such as fever, and skin redness, pain or discharge -signs of pregnancy -signs and symptoms of a blood clot such as breathing problems; changes in vision; chest pain; severe, sudden headache; pain, swelling, warmth in the leg; trouble speaking; sudden numbness or weakness of the face, arm or leg -signs and symptoms of liver injury like dark yellow or brown urine; general ill feeling or flu-like symptoms;  light-colored   stools; loss of appetite; nausea; right upper belly pain; unusually weak or tired; yellowing of the eyes or skin -unusual vaginal bleeding, discharge -signs and symptoms of a stroke like changes in vision; confusion; trouble speaking or understanding; severe headaches; sudden numbness or weakness of the face, arm or leg; trouble walking; dizziness; loss of balance or coordination Side effects that usually do not require medical attention (report to your doctor or health care professional if they continue or are bothersome): -acne -back pain -breast pain -changes in weight -dizziness -general ill feeling or flu-like symptoms -headache -irregular menstrual bleeding -nausea -sore throat -vaginal irritation or inflammation This list may not describe all possible side effects. Call your doctor for medical advice about side effects. You may report side effects to FDA at 1-800-FDA-1088. Where should I keep my medicine? This drug is given in a hospital or clinic and will not be stored at home. NOTE: This sheet is a summary. It may not cover all possible information. If you have questions about this medicine, talk to your doctor, pharmacist, or health care provider.  2018 Elsevier/Gold Standard (2015-08-07 11:19:22) Bacterial Vaginosis Bacterial vaginosis is a vaginal infection that occurs when the normal balance of bacteria in the vagina is disrupted. It results from an overgrowth of certain bacteria. This is the most common vaginal infection among women ages 30-44. Because bacterial vaginosis increases your risk for STIs (sexually transmitted infections), getting treated can help reduce your risk for chlamydia, gonorrhea, herpes, and HIV (human immunodeficiency virus). Treatment is also important for preventing complications in pregnant women, because this condition can cause an early (premature) delivery. What are the causes? This condition is caused by an increase in harmful  bacteria that are normally present in small amounts in the vagina. However, the reason that the condition develops is not fully understood. What increases the risk? The following factors may make you more likely to develop this condition:  Having a new sexual partner or multiple sexual partners.  Having unprotected sex.  Douching.  Having an intrauterine device (IUD).  Smoking.  Drug and alcohol abuse.  Taking certain antibiotic medicines.  Being pregnant.  You cannot get bacterial vaginosis from toilet seats, bedding, swimming pools, or contact with objects around you. What are the signs or symptoms? Symptoms of this condition include:  Grey or white vaginal discharge. The discharge can also be watery or foamy.  A fish-like odor with discharge, especially after sexual intercourse or during menstruation.  Itching in and around the vagina.  Burning or pain with urination.  Some women with bacterial vaginosis have no signs or symptoms. How is this diagnosed? This condition is diagnosed based on:  Your medical history.  A physical exam of the vagina.  Testing a sample of vaginal fluid under a microscope to look for a large amount of bad bacteria or abnormal cells. Your health care provider may use a cotton swab or a small wooden spatula to collect the sample.  How is this treated? This condition is treated with antibiotics. These may be given as a pill, a vaginal cream, or a medicine that is put into the vagina (suppository). If the condition comes back after treatment, a second round of antibiotics may be needed. Follow these instructions at home: Medicines  Take over-the-counter and prescription medicines only as told by your health care provider.  Take or use your antibiotic as told by your health care provider. Do not stop taking or using the antibiotic even if you start to feel  better. General instructions  If you have a female sexual partner, tell her that you  have a vaginal infection. She should see her health care provider and be treated if she has symptoms. If you have a female sexual partner, he does not need treatment.  During treatment: ? Avoid sexual activity until you finish treatment. ? Do not douche. ? Avoid alcohol as directed by your health care provider. ? Avoid breastfeeding as directed by your health care provider.  Drink enough water and fluids to keep your urine clear or pale yellow.  Keep the area around your vagina and rectum clean. ? Wash the area daily with warm water. ? Wipe yourself from front to back after using the toilet.  Keep all follow-up visits as told by your health care provider. This is important. How is this prevented?  Do not douche.  Wash the outside of your vagina with warm water only.  Use protection when having sex. This includes latex condoms and dental dams.  Limit how many sexual partners you have. To help prevent bacterial vaginosis, it is best to have sex with just one partner (monogamous).  Make sure you and your sexual partner are tested for STIs.  Wear cotton or cotton-lined underwear.  Avoid wearing tight pants and pantyhose, especially during summer.  Limit the amount of alcohol that you drink.  Do not use any products that contain nicotine or tobacco, such as cigarettes and e-cigarettes. If you need help quitting, ask your health care provider.  Do not use illegal drugs. Where to find more information:  Centers for Disease Control and Prevention: SolutionApps.co.zawww.cdc.gov/std  American Sexual Health Association (ASHA): www.ashastd.org  U.S. Department of Health and Health and safety inspectorHuman Services, Office on Women's Health: ConventionalMedicines.siwww.womenshealth.gov/ or http://www.anderson-williamson.info/https://www.womenshealth.gov/a-z-topics/bacterial-vaginosis Contact a health care provider if:  Your symptoms do not improve, even after treatment.  You have more discharge or pain when urinating.  You have a fever.  You have pain in your abdomen.  You have  pain during sex.  You have vaginal bleeding between periods. Summary  Bacterial vaginosis is a vaginal infection that occurs when the normal balance of bacteria in the vagina is disrupted.  Because bacterial vaginosis increases your risk for STIs (sexually transmitted infections), getting treated can help reduce your risk for chlamydia, gonorrhea, herpes, and HIV (human immunodeficiency virus). Treatment is also important for preventing complications in pregnant women, because the condition can cause an early (premature) delivery.  This condition is treated with antibiotic medicines. These may be given as a pill, a vaginal cream, or a medicine that is put into the vagina (suppository). This information is not intended to replace advice given to you by your health care provider. Make sure you discuss any questions you have with your health care provider. Document Released: 01/18/2005 Document Revised: 05/24/2016 Document Reviewed: 10/04/2015 Elsevier Interactive Patient Education  [employer]2018 Elsevier Inc.

## 2017-04-08 NOTE — Progress Notes (Signed)
Pt c/o vaginal discharge with an odor

## 2017-04-12 LAB — CERVICOVAGINAL ANCILLARY ONLY
BACTERIAL VAGINITIS: POSITIVE — AB
Candida vaginitis: NEGATIVE
Chlamydia: NEGATIVE
Neisseria Gonorrhea: NEGATIVE
TRICH (WINDOWPATH): NEGATIVE

## 2017-06-24 ENCOUNTER — Other Ambulatory Visit: Payer: 59

## 2017-06-24 DIAGNOSIS — N898 Other specified noninflammatory disorders of vagina: Secondary | ICD-10-CM

## 2017-06-24 NOTE — Progress Notes (Signed)
SUBJECTIVE:  32 y.o. female complains of vaginal discharge with odor for a week.  Denies abnormal vaginal bleeding or significant pelvic pain or fever. No UTI symptoms. Denies history of known exposure to STD.  No LMP recorded.  OBJECTIVE:  She appears well, afebrile. Urine dipstick: not done.  ASSESSMENT:  Vaginal Discharge  Vaginal Odor   PLAN:  GC, chlamydia, trichomonas, BVAG, CVAG probe sent to lab. Treatment: To be determined once lab results are received ROV prn if symptoms persist or worsen.

## 2017-06-25 LAB — WET PREP FOR TRICH, YEAST, CLUE
MICRO NUMBER:: 90633323
Specimen Quality: ADEQUATE

## 2017-06-28 ENCOUNTER — Telehealth: Payer: Self-pay

## 2017-06-28 DIAGNOSIS — B9689 Other specified bacterial agents as the cause of diseases classified elsewhere: Secondary | ICD-10-CM

## 2017-06-28 DIAGNOSIS — N760 Acute vaginitis: Principal | ICD-10-CM

## 2017-06-28 MED ORDER — METRONIDAZOLE 500 MG PO TABS: 500.0000 mg | ORAL_TABLET | Freq: Two times a day (BID) | ORAL | 0 refills | Status: DC

## 2017-06-28 NOTE — Telephone Encounter (Signed)
Left message on pt's phone letting her know of positive BV results and that medication has been sent to her pharmacy.

## 2018-03-20 IMAGING — US US MFM OB COMP +14 WKS
1 series · 14 of 28 positions shown · non-contrast
Comparison: none

[Series 1: us mfm ob comp +14 wks · 130 acquisitions, 14 frames shown]
[im 5/130]
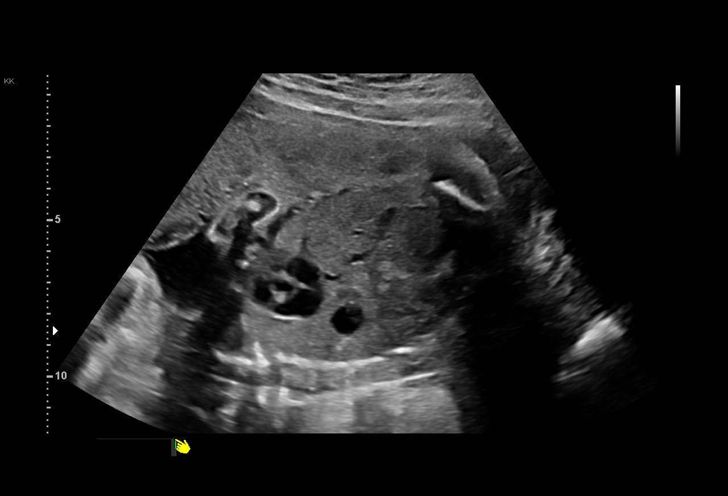
[im 15/130]
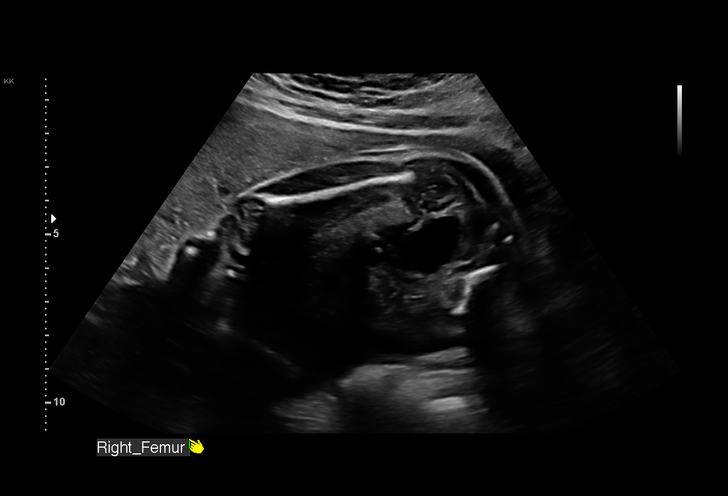
[im 24/130]
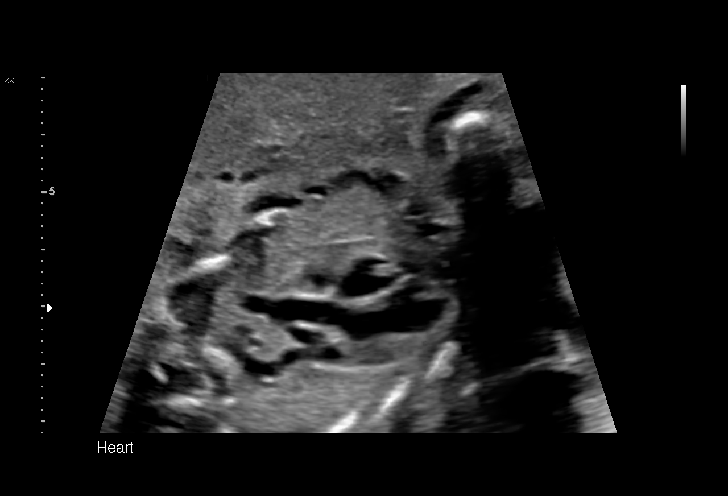
[im 34/130]
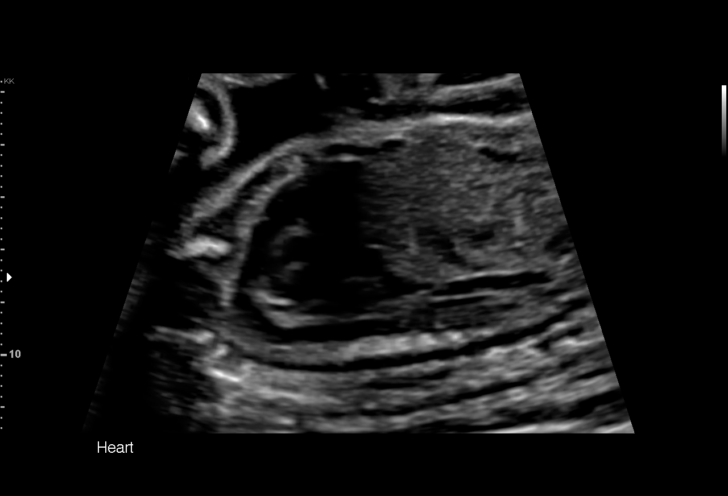
[im 44/130]
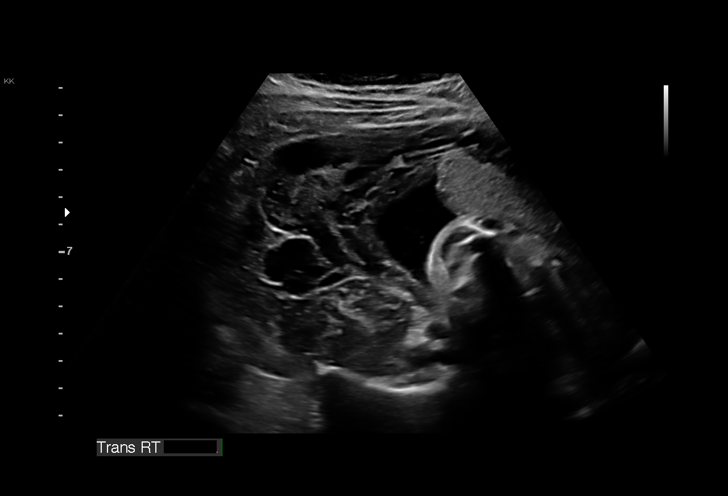
[im 53/130]
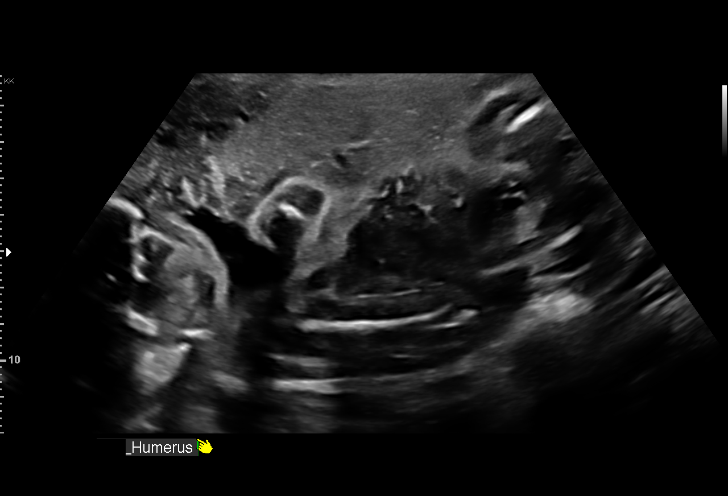
[im 63/130]
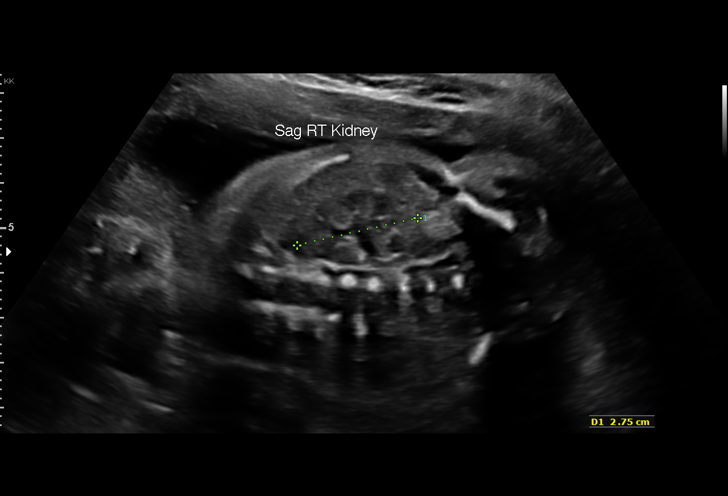
[im 72/130]
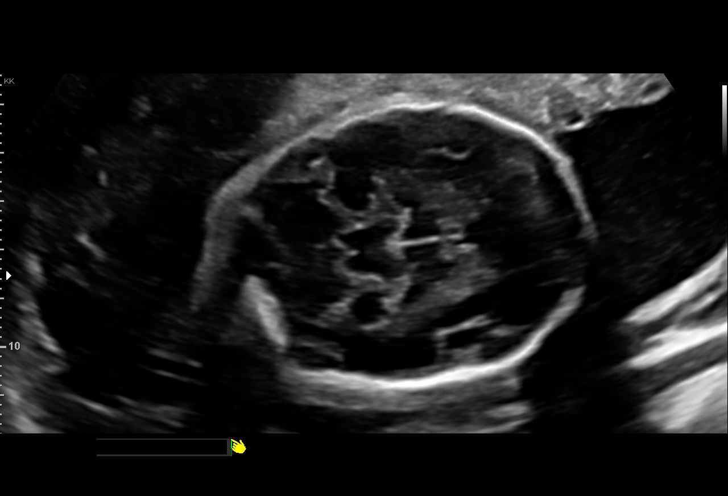
[im 82/130]
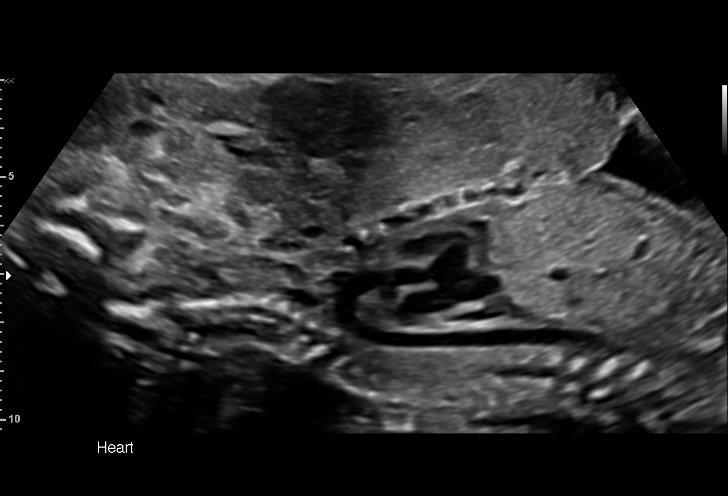
[im 91/130]
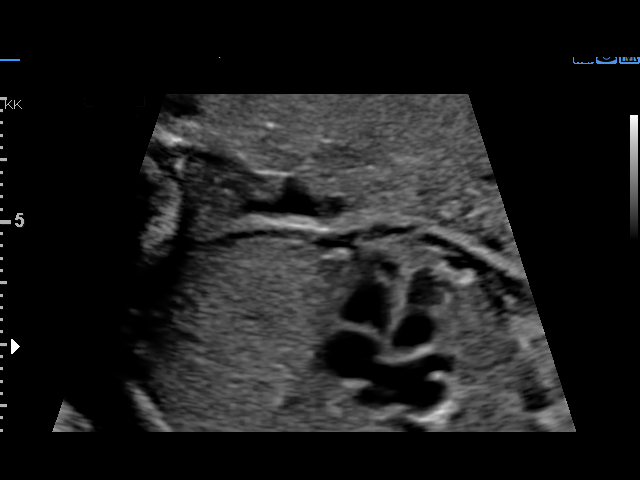
[im 101/130]
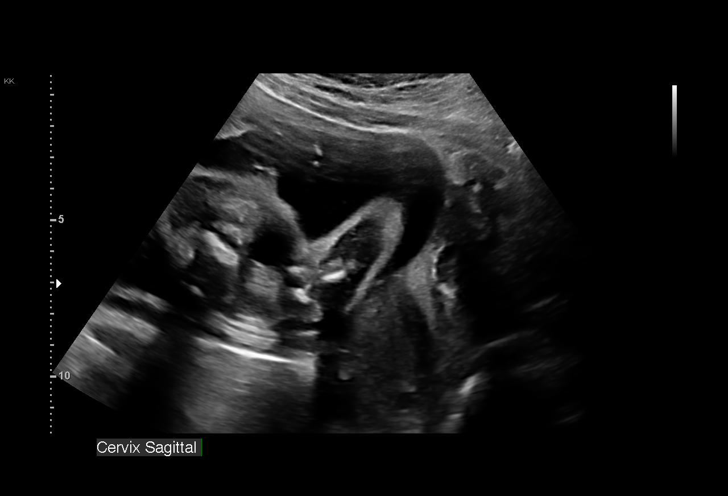
[im 110/130]
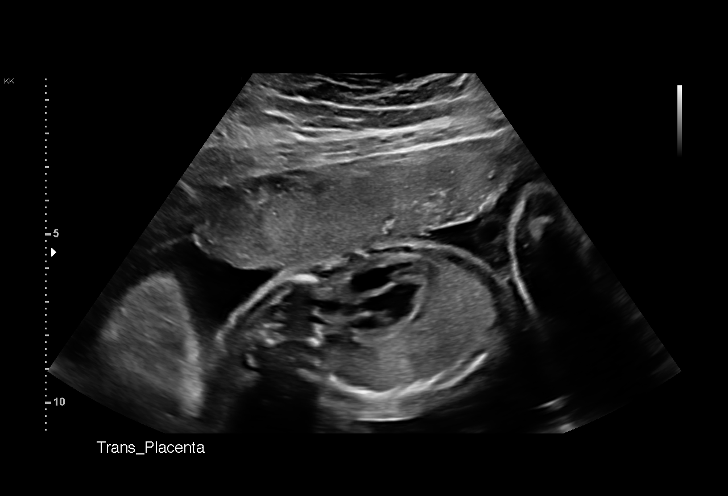
[im 120/130]
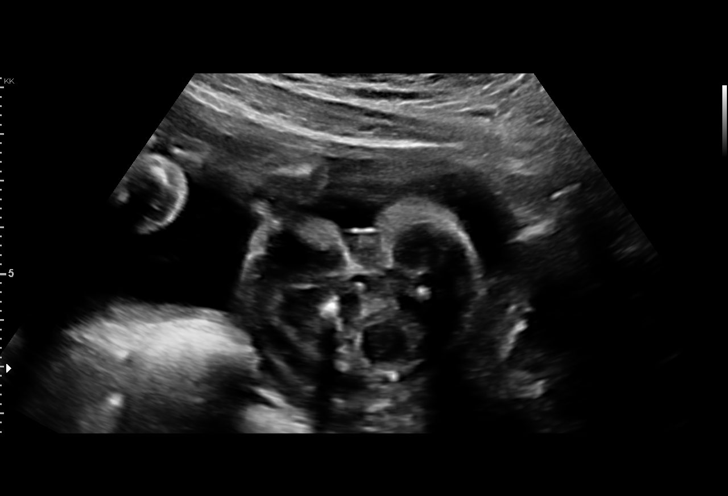
[im 130/130]
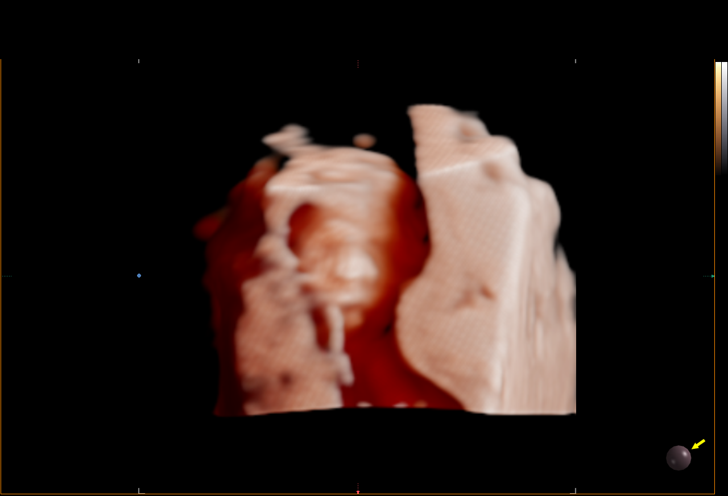

[14 of 28 positions shown; findings below may reference images not displayed]

for [REDACTED]care

1  JEURY VENTURA          [id number]      [id number]     [id number]
Indications

24 weeks gestation of pregnancy
Encounter for antenatal screening for
malformations
Late prenatal care, second trimester
OB History

Blood Type:            Height:  5'5"   Weight (lb):  170       BMI:
Gravidity:    2         Term:   1        Prem:   0        SAB:   0
TOP:          0       Ectopic:  0        Living: 1
Fetal Evaluation

Num Of Fetuses:     1
Fetal Heart         146
Rate(bpm):
Cardiac Activity:   Observed
Presentation:       Breech
Placenta:           Anterior, above cervical os
P. Cord Insertion:  Visualized

Amniotic Fluid
AFI FV:      Subjectively within normal limits

Largest Pocket(cm)
4.2
Biometry

BPD:      58.9  mm     G. Age:  24w 0d         17  %    CI:        69.41   %    70 - 86
FL/HC:       20.3  %    18.7 -
HC:      225.7  mm     G. Age:  24w 4d         22  %    HC/AC:       1.09       1.04 -
AC:      207.2  mm     G. Age:  25w 2d         55  %    FL/BPD:      77.8  %    71 - 87
FL:       45.8  mm     G. Age:  25w 2d         47  %    FL/AC:       22.1  %    20 - 24

Est. FW:     772   gm    1 lb 11 oz     58  %
Gestational Age

LMP:           24w 6d        Date:  07/29/15                 EDD:   05/04/16
U/S Today:     24w 6d                                        EDD:   05/04/16
Best:          24w 6d     Det. By:  LMP  (07/29/15)          EDD:   05/04/16
Anatomy

Cranium:               Appears normal         Aortic Arch:            Appears normal
Cavum:                 Appears normal         Ductal Arch:            Appears normal
Ventricles:            Appears normal         Diaphragm:              Appears normal
Choroid Plexus:        Appears normal         Stomach:                Appears normal, left
sided
Cerebellum:            Appears normal         Abdomen:                Appears normal
Posterior Fossa:       Appears normal         Abdominal Wall:         Appears nml (cord
insert, abd wall)
Nuchal Fold:           Not applicable (>20    Cord Vessels:           Appears normal (3
wks GA)                                        vessel cord)
Face:                  Appears normal         Kidneys:                Appear normal
(orbits and profile)
Lips:                  Appears normal         Bladder:                Appears normal
Thoracic:              Appears normal         Spine:                  Not well visualized
Heart:                 Appears normal         Upper Extremities:      Appears normal
(4CH, axis, and situs
RVOT:                  Appears normal         Lower Extremities:      Appears normal
LVOT:                  Appears normal

Other:  Female gender. Heels and 5th digit visualized. Technically difficult
due to fetal position.
Cervix Uterus Adnexa

Cervix
Length:              3  cm.
Normal appearance by transabdominal scan.

Left Ovary
Within normal limits.

Right Ovary
Within normal limits.

Adnexa:       No abnormality visualized.
Myomas

Site                     L(cm)      W(cm)      D(cm)       Location
Anterior

Blood Flow                 RI        PI       Comments

Impression

SIUP at 29w4d (remote read only)
active singleton fetus
EFW 58th%'le
no dysmorphic features
limitations as above (spine)
no previa
Recommendations

Recommend follow up attempt to complete survey in 6 weeks.

## 2018-10-31 ENCOUNTER — Other Ambulatory Visit: Payer: Self-pay

## 2018-10-31 ENCOUNTER — Ambulatory Visit (INDEPENDENT_AMBULATORY_CARE_PROVIDER_SITE_OTHER): Payer: Self-pay

## 2018-10-31 ENCOUNTER — Other Ambulatory Visit (HOSPITAL_COMMUNITY)
Admission: RE | Admit: 2018-10-31 | Discharge: 2018-10-31 | Disposition: A | Discharge status: Home / Self Care | Payer: Medicaid Other | Source: Ambulatory Visit | Attending: Certified Nurse Midwife | Admitting: Certified Nurse Midwife

## 2018-10-31 DIAGNOSIS — N898 Other specified noninflammatory disorders of vagina: Secondary | ICD-10-CM

## 2018-10-31 DIAGNOSIS — Z3200 Encounter for pregnancy test, result unknown: Secondary | ICD-10-CM

## 2018-10-31 DIAGNOSIS — Z3201 Encounter for pregnancy test, result positive: Secondary | ICD-10-CM

## 2018-10-31 LAB — POCT URINE PREGNANCY: Preg Test, Ur: POSITIVE — AB

## 2018-10-31 NOTE — Progress Notes (Signed)
PT c/o vaginal discharge and odor. Pt also requests UPT. UPT positive. NOB appt made. Self swab done and pt is aware we will call with results and treat if necessary.

## 2018-11-01 LAB — CERVICOVAGINAL ANCILLARY ONLY
Bacterial Vaginitis (gardnerella): POSITIVE — AB
Candida Glabrata: NEGATIVE
Candida Vaginitis: NEGATIVE
Molecular Disclaimer: NEGATIVE
Molecular Disclaimer: NEGATIVE
Molecular Disclaimer: NORMAL

## 2018-11-01 MED ORDER — METRONIDAZOLE 500 MG PO TABS: 500.0000 mg | ORAL_TABLET | Freq: Two times a day (BID) | ORAL | 0 refills | Status: DC

## 2018-11-01 NOTE — Addendum Note (Signed)
Addended by: Lajean Manes on: 11/01/2018 06:34 PM   Modules accepted: Orders

## 2018-11-21 ENCOUNTER — Ambulatory Visit (INDEPENDENT_AMBULATORY_CARE_PROVIDER_SITE_OTHER): Payer: Self-pay | Admitting: Advanced Practice Midwife

## 2018-11-21 ENCOUNTER — Other Ambulatory Visit: Payer: Medicaid Other

## 2018-11-21 ENCOUNTER — Encounter: Payer: Self-pay | Admitting: Advanced Practice Midwife

## 2018-11-21 ENCOUNTER — Other Ambulatory Visit: Payer: Self-pay

## 2018-11-21 ENCOUNTER — Other Ambulatory Visit (HOSPITAL_COMMUNITY)
Admission: RE | Admit: 2018-11-21 | Discharge: 2018-11-21 | Disposition: A | Discharge status: Home / Self Care | Payer: Medicaid Other | Source: Ambulatory Visit | Attending: Advanced Practice Midwife | Admitting: Advanced Practice Midwife

## 2018-11-21 VITALS — BP 115/67 | HR 91 | Wt 172.0 lb

## 2018-11-21 DIAGNOSIS — Z3480 Encounter for supervision of other normal pregnancy, unspecified trimester: Secondary | ICD-10-CM | POA: Insufficient documentation

## 2018-11-21 DIAGNOSIS — B9689 Other specified bacterial agents as the cause of diseases classified elsewhere: Secondary | ICD-10-CM

## 2018-11-21 DIAGNOSIS — Z3A10 10 weeks gestation of pregnancy: Secondary | ICD-10-CM

## 2018-11-21 DIAGNOSIS — O219 Vomiting of pregnancy, unspecified: Secondary | ICD-10-CM

## 2018-11-21 DIAGNOSIS — O26891 Other specified pregnancy related conditions, first trimester: Secondary | ICD-10-CM

## 2018-11-21 DIAGNOSIS — K117 Disturbances of salivary secretion: Secondary | ICD-10-CM

## 2018-11-21 DIAGNOSIS — N760 Acute vaginitis: Secondary | ICD-10-CM

## 2018-11-21 MED ORDER — METRONIDAZOLE 0.75 % VA GEL: 1.0000 | Freq: Every day | VAGINAL | 1 refills | Status: DC

## 2018-11-21 MED ORDER — GLYCOPYRROLATE 2 MG PO TABS: 2.0000 mg | ORAL_TABLET | Freq: Three times a day (TID) | ORAL | 3 refills | Status: DC | PRN

## 2018-11-21 MED ORDER — METOCLOPRAMIDE HCL 10 MG PO TABS: 10.0000 mg | ORAL_TABLET | Freq: Three times a day (TID) | ORAL | 2 refills | Status: DC | PRN

## 2018-11-21 NOTE — Patient Instructions (Signed)

## 2018-11-21 NOTE — Progress Notes (Signed)
Last pap 12/17.  Bedside U/S shows single IUP with FHT of 171 BPM and CRL measures 31.95 mm  GA [redacted]w[redacted]d

## 2018-11-21 NOTE — Progress Notes (Signed)
Subjective:   Kathleen Morrison is a 33 y.o. G3P2002 at [redacted]w[redacted]d by LMP, c/w early ultrasound being seen today for her first obstetrical visit.  Her obstetrical history is significant for advanced maternal age. Patient does intend to breast feed. Pregnancy history fully reviewed.  Patient reports nausea and spitting, as well as vaginal discharge with an odor. Was recently treated for BV 1-2 weeks ago. Thinks it is BV. She denies vaginal itching/irritation, abdominal/pelvic pain or vaginal bleeding.  HISTORY: OB History  Gravida Para Term Preterm AB Living  3 2 2  0 0 2  SAB TAB Ectopic Multiple Live Births  0 0 0 0 2    # Outcome Date GA Lbr Len/2nd Weight Sex Delivery Anes PTL Lv  3 Current           2 Term 05/06/16 [redacted]w[redacted]d 06:20 / 00:04 2988 g F Vag-Spont None  LIV     Birth Comments: none     Name: Deeg,GIRL Nakisha     Apgar1: 8  Apgar5: 9  1 Term 09/11/11   2722 g F Vag-Spont   LIV     Birth Comments: Waterbirth ([place])     Complications: Hip dysplasia    Obstetric Comments  Doren Custard ([place])   Past Medical History:  Diagnosis Date  . Anemia   . Vaginal Pap smear, abnormal    History reviewed. No pertinent surgical history. Family History  Problem Relation Age of Onset  . Diabetes Mother   . Anxiety disorder Sister   . Cerebral palsy Brother   . Autism Brother    Social History   Tobacco Use  . Smoking status: Never Smoker  . Smokeless tobacco: Never Used  Substance Use Topics  . Alcohol use: Yes    Comment: occassionally  . Drug use: No   Allergies  Allergen Reactions  . Pineapple Rash   Current Outpatient Medications on File Prior to Visit  Medication Sig Dispense Refill  . acetaminophen (TYLENOL) 325 MG tablet Take 2 tablets (650 mg total) by mouth every 4 (four) hours as needed (for pain scale < 4). 30 tablet 0  . calcium carbonate (TUMS) 500 MG chewable tablet Chew 1 tablet by mouth daily.    Marland Kitchen loratadine (CLARITIN) 10 MG tablet Take 10 mg  by mouth daily.    . pantoprazole (PROTONIX) 20 MG tablet Take 1 tablet (20 mg total) by mouth daily. (Patient not taking: Reported on 06/14/2016) 30 tablet 2   No current facility-administered medications on file prior to visit.     Indications for ASA therapy (per uptodate) One of the following: Previous pregnancy with preeclampsia, especially early onset and with an adverse outcome No Multifetal gestation No Chronic hypertension No Type 1 or 2 diabetes mellitus No Chronic kidney disease No Autoimmune disease (antiphospholipid syndrome, systemic lupus erythematosus) No  Two or more of the following: Nulliparity No Obesity (body mass index >30 kg/m2) No Family history of preeclampsia in mother or sister No Age ?35 years Yes Sociodemographic characteristics (African American race, low socioeconomic level) Yes Personal risk factors (eg, previous pregnancy with low birth weight or small for gestational age infant, previous adverse pregnancy outcome [eg, stillbirth], interval >10 years between pregnancies) No  Indications for early 1 hour GTT (per uptodate)  BMI >25 (>23 in Asian women) AND one of the following  Gestational diabetes mellitus in a previous pregnancy No Glycated hemoglobin ?5.7 percent (39 mmol/mol), impaired glucose tolerance, or impaired fasting glucose on previous testing No First-degree  relative with diabetes No High-risk race/ethnicity (eg, African American, Latino, Native American, [place] American, Pacific Islander) Yes History of cardiovascular disease No Hypertension or on therapy for hypertension No High-density lipoprotein cholesterol level <35 mg/dL (3.89 mmol/L) and/or a triglyceride level >250 mg/dL (3.73 mmol/L) No Polycystic ovary syndrome No Physical inactivity No Other clinical condition associated with insulin resistance (eg, severe obesity, acanthosis nigricans) No Previous birth of an infant weighing ?4000 g No Previous stillbirth of unknown cause  No   Exam   Vitals:   11/21/18 1026  BP: 115/67  Pulse: 91  Weight: 78 kg   Fetal Heart Rate (bpm): 171  Uterus:     Pelvic Exam: Perineum: no hemorrhoids, normal perineum   Vulva: external genitalia without lesions   Vagina:  Small amount of thin, white noted at introitus   Cervix:  pap smear deferred    Adnexa: Deferred    Bony Pelvis: average  System: General: well-developed, well-nourished female in no acute distress   Breast:  normal appearance, no masses or tenderness   Skin: normal coloration and turgor, no rashes   Neurologic: oriented, normal, negative, normal mood   Extremities: normal strength, tone, and muscle mass, ROM of all joints is normal   HEENT PERRLA, extraocular movement intact and sclera clear, anicteric   Mouth/Teeth mucous membranes moist, pharynx normal without lesions and dental hygiene good   Neck supple and no masses   Cardiovascular: regular rate and rhythm   Respiratory:  no respiratory distress, normal breath sounds   Abdomen: soft, non-tender; bowel sounds normal; no masses,  no organomegaly     Assessment:   Pregnancy: [id number] Patient Active Problem List   Diagnosis Date Noted  . Rupture of membranes with clear amniotic fluid 05/06/2016  . GBS (group B Streptococcus carrier), +RV culture, currently pregnant 04/15/2016  . Gastroesophageal reflux disease with esophagitis 03/15/2016  . Supervision of other normal pregnancy, antepartum 01/12/2016  . Late prenatal care affecting pregnancy, antepartum 01/12/2016     Plan:  1. Supervision of other normal pregnancy, antepartum - NOB visit today - C/o nausea and spitting - NOB labs today - Vaginal swabs collected   - Babyscripts Schedule Optimization - Obstetric panel - HIV antibody (with reflex) - Culture, OB Urine - Urine cytology ancillary only([place]) - Cervicovaginal ancillary only( [place])  2. Bacterial vaginosis - Pt with vaginal discharge with odor - Small amount  of thin, white discharge noted at introitus - Blind vaginal swab collected today -Will Rx metrogel  - metroNIDAZOLE (METROGEL) 0.75 % vaginal gel; Place 1 Applicatorful vaginally at bedtime. Apply one applicatorful to vagina at bedtime for 5 days  Dispense: 70 g; Refill: 1  3. Ptyalism - Pt c/o excessive saliva and spitting, causing nausea - Will Rx Robinul   - glycopyrrolate (ROBINUL) 2 MG tablet; Take 1 tablet (2 mg total) by mouth 3 (three) times daily as needed.  Dispense: 30 tablet; Refill: 3  4. Nausea and vomiting during pregnancy prior to [redacted] weeks gestation - Pt c/o excessive saliva and spitting, causing nausea - Reglan sent to pharmacy   - metoCLOPramide (REGLAN) 10 MG tablet; Take 1 tablet (10 mg total) by mouth 3 (three) times daily with meals as needed for nausea.  Dispense: 90 tablet; Refill: 2   Initial labs drawn. Continue prenatal vitamins. Genetic Screening discussed, NIPS: declined. Ultrasound discussed; fetal anatomic survey: will order at next visit Problem list reviewed and updated The nature of [facility]  with multiple MDs and other Advanced Practice Providers was explained to patient; also emphasized that fellows, residents, and students are part of our team. Routine obstetric precautions reviewed Return in about 4 weeks (around 12/19/2018).   Brand Malesanielle L Keymiah Lyles, SNM 12:11 PM 11/21/18

## 2018-11-22 LAB — OBSTETRIC PANEL
Absolute Monocytes: 256 cells/uL (ref 200–950)
Antibody Screen: NOT DETECTED
Basophils Absolute: 29 cells/uL (ref 0–200)
Basophils Relative: 0.9 %
Eosinophils Absolute: 70 cells/uL (ref 15–500)
Eosinophils Relative: 2.2 %
HCT: 35.1 % (ref 35.0–45.0)
Hemoglobin: 11 g/dL — ABNORMAL LOW (ref 11.7–15.5)
Hepatitis B Surface Ag: NONREACTIVE
Lymphs Abs: 998 cells/uL (ref 850–3900)
MCH: 27 pg (ref 27.0–33.0)
MCHC: 31.3 g/dL — ABNORMAL LOW (ref 32.0–36.0)
MCV: 86 fL (ref 80.0–100.0)
MPV: 9.9 fL (ref 7.5–12.5)
Monocytes Relative: 8 %
Neutro Abs: 1846 cells/uL (ref 1500–7800)
Neutrophils Relative %: 57.7 %
Platelets: 207 10*3/uL (ref 140–400)
RBC: 4.08 10*6/uL (ref 3.80–5.10)
RDW: 14.2 % (ref 11.0–15.0)
RPR Ser Ql: NONREACTIVE
Rubella: 2.09 index
Total Lymphocyte: 31.2 %
WBC: 3.2 10*3/uL — ABNORMAL LOW (ref 3.8–10.8)

## 2018-11-22 LAB — HIV ANTIBODY (ROUTINE TESTING W REFLEX): HIV 1&2 Ab, 4th Generation: NONREACTIVE

## 2018-11-23 LAB — CULTURE, OB URINE

## 2018-11-23 LAB — URINE CULTURE, OB REFLEX

## 2018-11-24 LAB — URINE CYTOLOGY ANCILLARY ONLY
Chlamydia: NEGATIVE
Comment: NEGATIVE
Comment: NORMAL
Neisseria Gonorrhea: NEGATIVE

## 2018-11-29 LAB — CERVICOVAGINAL ANCILLARY ONLY
Bacterial Vaginitis (gardnerella): NEGATIVE
Chlamydia: NEGATIVE
Comment: NEGATIVE
Comment: NEGATIVE
Comment: NORMAL
Neisseria Gonorrhea: NEGATIVE

## 2018-12-19 ENCOUNTER — Other Ambulatory Visit: Payer: Self-pay

## 2018-12-19 ENCOUNTER — Other Ambulatory Visit (HOSPITAL_COMMUNITY)
Admission: RE | Admit: 2018-12-19 | Discharge: 2018-12-19 | Disposition: A | Discharge status: Home / Self Care | Payer: Medicaid Other | Source: Ambulatory Visit | Attending: Advanced Practice Midwife | Admitting: Advanced Practice Midwife

## 2018-12-19 ENCOUNTER — Ambulatory Visit (INDEPENDENT_AMBULATORY_CARE_PROVIDER_SITE_OTHER): Payer: Medicaid Other | Admitting: Advanced Practice Midwife

## 2018-12-19 VITALS — BP 116/78 | HR 104 | Wt 169.0 lb

## 2018-12-19 DIAGNOSIS — O09522 Supervision of elderly multigravida, second trimester: Secondary | ICD-10-CM | POA: Diagnosis not present

## 2018-12-19 DIAGNOSIS — N898 Other specified noninflammatory disorders of vagina: Secondary | ICD-10-CM | POA: Diagnosis not present

## 2018-12-19 DIAGNOSIS — B3731 Acute candidiasis of vulva and vagina: Secondary | ICD-10-CM

## 2018-12-19 DIAGNOSIS — Z3A19 19 weeks gestation of pregnancy: Secondary | ICD-10-CM

## 2018-12-19 DIAGNOSIS — K117 Disturbances of salivary secretion: Secondary | ICD-10-CM

## 2018-12-19 DIAGNOSIS — Z3A14 14 weeks gestation of pregnancy: Secondary | ICD-10-CM

## 2018-12-19 DIAGNOSIS — B373 Candidiasis of vulva and vagina: Secondary | ICD-10-CM

## 2018-12-19 MED ORDER — TERCONAZOLE 0.4 % VA CREA: 1.0000 | TOPICAL_CREAM | Freq: Every day | VAGINAL | 0 refills | Status: DC

## 2018-12-19 NOTE — Progress Notes (Signed)
Pt states she has mucous like discharge.

## 2018-12-19 NOTE — Progress Notes (Signed)
   PRENATAL VISIT NOTE  Subjective:  Kathleen Morrison is a 33 y.o. G3P2002 at [redacted]w[redacted]d being seen today for ongoing prenatal care.  She is currently monitored for the following issues for this low-risk pregnancy and has Supervision of other normal pregnancy, antepartum; Late prenatal care affecting pregnancy, antepartum; Gastroesophageal reflux disease with esophagitis; GBS (group B Streptococcus carrier), +RV culture, currently pregnant; and Rupture of membranes with clear amniotic fluid on their problem list.  Patient reports white vaginal discharged that started 3-4 days ago without vaginal bleeding, discharge, smell or irritation.  Contractions: Not present. Vag. Bleeding: None.  Movement: Absent. Denies leaking of fluid.  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 Patient reports no improvement with Robinul.  Overall, patient reports feeling good and her nausea has resolved.   The following portions of the patient's history were reviewed and updated as appropriate: allergies, current medications, past family history, past medical history, past social history, past surgical history and problem list.  Patient declines NIPS today.   Objective:   Vitals:   12/19/18 0939  BP: 116/78  Pulse: (!) 104  Weight: 76.7 kg    Fetal Status: Fetal Heart Rate (bpm):     156bpm via [place] Movement: Absent     General:  Alert, oriented and cooperative. Patient is in no acute distress.  Skin: Skin is warm and dry. No rash noted.   Cardiovascular: Normal heart rate noted  Respiratory: Normal respiratory effort, no problems with respiration noted  Abdomen: Soft, gravid, appropriate for gestational age.  Pain/Pressure: Absent     Pelvic: Cervical exam performed       White and cottage cheese-like vaginal discharge in vagina and vaginal vault. Normal appearing cervix.  Extremities: Normal range of motion.  Edema: None  Mental Status: Normal mood and affect. Normal behavior. Normal  judgment and thought content.   Assessment and Plan:  Pregnancy: G3P2002 at [redacted]w[redacted]d There are no diagnoses linked to this encounter. Preterm labor symptoms and general obstetric precautions including but not limited to vaginal bleeding, contractions, leaking of fluid and fetal movement were reviewed in detail with the patient. Please refer to After Visit Summary for other counseling recommendations.   1. Vaginal discharge / Vaginal Candidiasis - Cervicovaginal ancillary only( [place]). - terconazole (TERAZOL 7) 0.4 % vaginal cream; Place 1 applicator vaginally at bedtime.  Dispense: 45 g; Refill: 0  3. Multigravida of advanced maternal age in second trimester - Declined NIPS today, will order detailed anatomical [place] for her next visit.  - [place] MFM OB DETAIL +14 WK; Future - Patient qualifies for ASA indication due to her advanced maternal age and race. Educational counseling given and patient agrees with low ASA therapy. Start 81mg  ASA, PO, QD.   4. Ptyalism Continue current Tx and add OTC benadryl. Patient to f/u if no improvement.    Future Appointments  Date Time Provider [place]  01/16/2019  9:15 AM Simona Huh [facility]  01/19/2019  9:30 AM [place] 5 WH-MFCUS MFC-US  01/19/2019  9:40 AM [place] MFC-US    Hessie Dibble, Student-PA

## 2018-12-20 LAB — CERVICOVAGINAL ANCILLARY ONLY
Bacterial Vaginitis (gardnerella): NEGATIVE
Candida Glabrata: NEGATIVE
Candida Vaginitis: NEGATIVE
Comment: NEGATIVE
Comment: NEGATIVE
Comment: NEGATIVE

## 2019-01-16 ENCOUNTER — Telehealth (INDEPENDENT_AMBULATORY_CARE_PROVIDER_SITE_OTHER): Payer: Medicaid Other | Admitting: Advanced Practice Midwife

## 2019-01-16 ENCOUNTER — Encounter: Payer: Self-pay | Admitting: Advanced Practice Midwife

## 2019-01-16 VITALS — BP 108/74 | HR 100 | Wt 169.0 lb

## 2019-01-16 DIAGNOSIS — O09529 Supervision of elderly multigravida, unspecified trimester: Secondary | ICD-10-CM | POA: Insufficient documentation

## 2019-01-16 DIAGNOSIS — O09522 Supervision of elderly multigravida, second trimester: Secondary | ICD-10-CM

## 2019-01-16 DIAGNOSIS — Z3A18 18 weeks gestation of pregnancy: Secondary | ICD-10-CM

## 2019-01-16 DIAGNOSIS — Z3480 Encounter for supervision of other normal pregnancy, unspecified trimester: Secondary | ICD-10-CM

## 2019-01-16 DIAGNOSIS — K219 Gastro-esophageal reflux disease without esophagitis: Secondary | ICD-10-CM

## 2019-01-16 DIAGNOSIS — K117 Disturbances of salivary secretion: Secondary | ICD-10-CM | POA: Insufficient documentation

## 2019-01-16 DIAGNOSIS — O2612 Low weight gain in pregnancy, second trimester: Secondary | ICD-10-CM

## 2019-01-16 DIAGNOSIS — O219 Vomiting of pregnancy, unspecified: Secondary | ICD-10-CM | POA: Insufficient documentation

## 2019-01-16 MED ORDER — PANTOPRAZOLE SODIUM 40 MG PO TBEC: 40.0000 mg | DELAYED_RELEASE_TABLET | Freq: Every day | ORAL | 5 refills | Status: DC

## 2019-01-16 MED ORDER — ENSURE PO LIQD: 237.0000 mL | Freq: Two times a day (BID) | ORAL | 12 refills | Status: DC

## 2019-01-16 NOTE — Progress Notes (Signed)
TELEHEALTH OBSTETRICS PRENATAL VIRTUAL VIDEO VISIT ENCOUNTER NOTE  Provider location: [facility] [place]   I connected with [employer] on 01/16/19 at  9:15 AM EST by MyChart Video Encounter at home and verified that I am speaking with the correct person using two identifiers.   I discussed the limitations, risks, security and privacy concerns of performing an evaluation and management service virtually and the availability of in person appointments. I also discussed with the patient that there may be a patient responsible charge related to this service. The patient expressed understanding and agreed to proceed. Subjective:  Kathleen Morrison is a 33 y.o. G3P2002 at [redacted]w[redacted]d being seen today for ongoing prenatal care.  She is currently monitored for the following issues for this low-risk pregnancy and has Supervision of other normal pregnancy, antepartum; Late prenatal care affecting pregnancy, antepartum; Gastroesophageal reflux disease with esophagitis; GBS (group B Streptococcus carrier), +RV culture, currently pregnant; and Rupture of membranes with clear amniotic fluid on their problem list.  Patient reports nausea and decreased appetite.  Contractions: Not present. Vag. Bleeding: None.   . Denies any leaking of fluid.   The following portions of the patient's history were reviewed and updated as appropriate: allergies, current medications, past family history, past medical history, past social history, past surgical history and problem list.   Objective:   Vitals:   01/16/19 0903  BP: 108/74  Pulse: 100  Weight: 169 lb (76.7 kg)    Fetal Status:           General:  Alert, oriented and cooperative. Patient is in no acute distress.  Respiratory: Normal respiratory effort, no problems with respiration noted  Mental Status: Normal mood and affect. Normal behavior. Normal judgment and thought content.  Rest of physical exam deferred due to type of  encounter  Imaging: No results found.  Assessment and Plan:  Pregnancy: G3P2002 at [redacted]w[redacted]d  1. Gastroesophageal reflux disease without esophagitis --Daily heartburn. Using Tums with some benefit.  Pt unable to eat due to ptyalism and nausea/vomiting.  Nausea medications have not been helpful.  Will try daily PPI to improve acid, hopefully improve appetite/food tolerance. - pantoprazole (PROTONIX) 40 MG tablet; Take 1 tablet (40 mg total) by mouth daily.  Dispense: 30 tablet; Refill: 5  2. Nausea and vomiting during pregnancy prior to [redacted] weeks gestation   3. Multigravida of advanced maternal age in second trimester --Anticipatory guidance about next visits/weeks of pregnancy given. --Anatomy US on Friday, next visit in 4 weeks, virtual  4. Poor weight gain of pregnancy, second trimester --Pt with little weight gain in pregnancy at 18 weeks, is unable to eat solid foods due to vomiting.  --She qualifies but has not yet enrolled in [facility] program.  --Pt to enroll in [facility] and let us know so Rx can be sent to [facility] for Ensure. - Ensure (ENSURE); Take 237 mLs by mouth 2 (two) times daily between meals.  Dispense: 237 mL; Refill: 12  5. 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  Will try PPI, see GERD above.   Preterm labor symptoms and general obstetric precautions including but not limited to vaginal bleeding, contractions, leaking of fluid and fetal movement were reviewed in detail with the patient. I discussed the assessment and treatment plan with the patient. The patient was provided an opportunity to ask questions and all were answered. The patient agreed with the plan and demonstrated an understanding of the instructions. The patient was advised  to call back or seek an in-person office evaluation/go to MAU at [facility] for any urgent or concerning symptoms. Please refer to After Visit Summary for other counseling recommendations.   I provided 10  minutes of face-to-face time during this encounter.  No follow-ups on file.  Future Appointments  Date Time Provider [place]  01/19/2019  9:30 AM WH-MFC [place] 5 WH-MFCUS MFC-US  01/19/2019  9:40 AM [place] MFC-US    Fatima Blank, [place] for [employer], [facility]

## 2019-01-19 ENCOUNTER — Encounter (HOSPITAL_COMMUNITY): Payer: Self-pay

## 2019-01-19 ENCOUNTER — Ambulatory Visit (HOSPITAL_COMMUNITY)
Admission: RE | Admit: 2019-01-19 | Discharge: 2019-01-19 | Disposition: A | Discharge status: Home / Self Care | Payer: Medicaid Other | Source: Ambulatory Visit | Attending: Obstetrics and Gynecology | Admitting: Obstetrics and Gynecology

## 2019-01-19 ENCOUNTER — Ambulatory Visit (HOSPITAL_COMMUNITY): Payer: Medicaid Other

## 2019-01-19 ENCOUNTER — Ambulatory Visit (HOSPITAL_COMMUNITY): Payer: Medicaid Other | Admitting: *Deleted

## 2019-01-19 ENCOUNTER — Other Ambulatory Visit (HOSPITAL_COMMUNITY): Payer: Self-pay | Admitting: *Deleted

## 2019-01-19 ENCOUNTER — Other Ambulatory Visit: Payer: Self-pay

## 2019-01-19 VITALS — BP 113/70 | HR 100 | Temp 97.3°F

## 2019-01-19 DIAGNOSIS — O0990 Supervision of high risk pregnancy, unspecified, unspecified trimester: Secondary | ICD-10-CM | POA: Diagnosis not present

## 2019-01-19 DIAGNOSIS — O2612 Low weight gain in pregnancy, second trimester: Secondary | ICD-10-CM | POA: Diagnosis not present

## 2019-01-19 DIAGNOSIS — O09522 Supervision of elderly multigravida, second trimester: Secondary | ICD-10-CM | POA: Insufficient documentation

## 2019-01-19 DIAGNOSIS — O99012 Anemia complicating pregnancy, second trimester: Secondary | ICD-10-CM | POA: Diagnosis not present

## 2019-01-19 DIAGNOSIS — Z36 Encounter for antenatal screening for chromosomal anomalies: Secondary | ICD-10-CM | POA: Insufficient documentation

## 2019-01-19 DIAGNOSIS — Z3A18 18 weeks gestation of pregnancy: Secondary | ICD-10-CM

## 2019-01-19 DIAGNOSIS — O09529 Supervision of elderly multigravida, unspecified trimester: Secondary | ICD-10-CM

## 2019-01-28 LAB — MATERNIT21 PLUS CORE+SCA
Fetal Fraction: 8
Monosomy X (Turner Syndrome): NOT DETECTED
Result (T21): NEGATIVE
Trisomy 13 (Patau syndrome): NEGATIVE
Trisomy 18 (Edwards syndrome): NEGATIVE
Trisomy 21 (Down syndrome): NEGATIVE
XXX (Triple X Syndrome): NOT DETECTED
XXY (Klinefelter Syndrome): NOT DETECTED
XYY (Jacobs Syndrome): NOT DETECTED

## 2019-01-29 ENCOUNTER — Telehealth (HOSPITAL_COMMUNITY): Payer: Self-pay | Admitting: Genetic Counselor

## 2019-01-29 NOTE — Telephone Encounter (Signed)
Attempted to call Ms. Laursen to discuss her negative noninvasive prenatal screening (NIPS) results with her; however, her voicemail was full so I was unable to leave a message.   Buelah Manis, MS Genetic Counselor

## 2019-01-30 ENCOUNTER — Other Ambulatory Visit (HOSPITAL_COMMUNITY): Payer: Self-pay | Admitting: Genetic Counselor

## 2019-01-30 NOTE — Telephone Encounter (Signed)
Entered in error please ignore

## 2019-01-31 ENCOUNTER — Telehealth (HOSPITAL_COMMUNITY): Payer: Self-pay | Admitting: Genetic Counselor

## 2019-01-31 NOTE — Telephone Encounter (Signed)
Attempted to call Ms. Radloff again to discuss her negative noninvasive prenatal screening (NIPS) results with her; however, the call went straight to voicemail and her voicemail was full so I was unable to leave a message.   Buelah Manis, MS Genetic Counselor

## 2019-02-02 NOTE — L&D Delivery Note (Signed)
Kathleen Morrison is a 34 y.o. female G45P2002 with IUP at [redacted]w[redacted]d admitted for postdates IOL.  She progressed with cytotec augmentation to complete and delivered in the water.  Cord clamping delayed by several minutes then clamped by CNM and cut by FOB.    Delivery Note At 8:09 PM a viable female was delivered via Vaginal, Spontaneous (Presentation: LOA).  APGAR: 8, 9; weight pending.   Placenta status: Spontaneous, Intact.  Cord: 3 vessels  Anesthesia: None Episiotomy: None Lacerations:  1st degree, hemostatic, unrepaired Suture Repair: n/a Est. Blood Loss (mL):  100  Mom to postpartum.  Baby to Couplet care / Skin to Skin.  Rolm Bookbinder CNM 06/23/2019, 8:34 PM

## 2019-04-03 ENCOUNTER — Ambulatory Visit (INDEPENDENT_AMBULATORY_CARE_PROVIDER_SITE_OTHER): Payer: Medicaid Other | Admitting: Certified Nurse Midwife

## 2019-04-03 ENCOUNTER — Other Ambulatory Visit: Payer: Self-pay

## 2019-04-03 VITALS — BP 110/74 | HR 94 | Wt 174.0 lb

## 2019-04-03 DIAGNOSIS — Z3480 Encounter for supervision of other normal pregnancy, unspecified trimester: Secondary | ICD-10-CM | POA: Diagnosis not present

## 2019-04-03 DIAGNOSIS — O09522 Supervision of elderly multigravida, second trimester: Secondary | ICD-10-CM

## 2019-04-03 DIAGNOSIS — O09523 Supervision of elderly multigravida, third trimester: Secondary | ICD-10-CM

## 2019-04-03 DIAGNOSIS — Z23 Encounter for immunization: Secondary | ICD-10-CM | POA: Diagnosis not present

## 2019-04-03 DIAGNOSIS — O99013 Anemia complicating pregnancy, third trimester: Secondary | ICD-10-CM

## 2019-04-03 DIAGNOSIS — Z3A29 29 weeks gestation of pregnancy: Secondary | ICD-10-CM

## 2019-04-03 NOTE — Patient Instructions (Signed)
Considering Waterbirth? Guide for patients at [facility]  Why consider waterbirth?  . Gentle birth for babies . Less pain medicine used in labor . May allow for passive descent/less pushing . May reduce perineal tears  . More mobility and instinctive maternal position changes . Increased maternal relaxation . Reduced blood pressure in labor  Is waterbirth safe? What are the risks of infection, drowning or other complications?  . Infection: o Very low risk (3.7 % for tub vs 4.8% for bed) o 7 in 8000 waterbirths with documented infection o Poorly cleaned equipment most common cause o Slightly lower group B strep transmission rate  . Drowning o Maternal:  - Very low risk   - Related to seizures or fainting o Newborn:  - Very low risk. No evidence of increased risk of respiratory problems in multiple large studies - Physiological protection from breathing under water - Avoid underwater birth if there are any fetal complications - Once baby's head is out of the water, keep it out.  . Birth complication o Some reports of cord trauma, but risk decreased by bringing baby to surface gradually o No evidence of increased risk of shoulder dystocia. Mothers can usually change positions faster in water than in a bed, possibly aiding the maneuvers to free the shoulder.   You must attend a Waterbirth class at [facility]  3rd Wednesday of every month from 7-9pm  Free  Register by calling [phone] or online at [email or web address]  Bring us the certificate from the class to your prenatal appointment  Meet with a midwife at 36 weeks to see if you can still plan a waterbirth and to sign the consent.   If you plan a waterbirth at [facility] [place], you will need to purchase the following:  Fish net  Bathing suit top (optional)  Long-handled mirror (optional)  Places to purchase or rent supplies:   Yourwaterbirth.com  for tub purchases and supplies  Waterbirthsolutions.com for tub purchases and supplies  The [employer] Ladies (www.thelaborladies.com) $275 for tub rental/set-up & take down/kit   [place] [employer] (http://www.padanc.org/MeetUs.htm) Information regarding doulas (labor support) who provide pool rentals  Things that would prevent you from having a waterbirth:  Premature, <37wks  Previous cesarean birth  Presence of thick meconium-stained fluid  Multiple gestation (Twins, triplets, etc.)  Uncontrolled diabetes or gestational diabetes requiring medication  Hypertension requiring medication or diagnosis of pre-eclampsia  Heavy vaginal bleeding  Non-reassuring fetal heart rate  Active infection (MRSA, etc.). Group B Strep is NOT a contraindication for waterbirth.  If your labor has to be induced and induction method requires continuous monitoring of the baby's heart rate  Other risks/issues identified by your obstetrical provider  Please remember that birth is unpredictable. Under certain unforeseeable circumstances your provider may advise against giving birth in the tub. These decisions will be made on a case-by-case basis and with the safety of you and your baby as our highest priority.    

## 2019-04-03 NOTE — Progress Notes (Signed)
   PRENATAL VISIT NOTE  Subjective:  Kathleen Morrison is a 34 y.o. G3P2002 at [redacted]w[redacted]d being seen today for ongoing prenatal care.  She is currently monitored for the following issues for this low-risk pregnancy and has Supervision of other normal pregnancy, antepartum; Gastroesophageal reflux disease with esophagitis; Multigravida of advanced maternal age in second trimester; Nausea and vomiting during pregnancy prior to [redacted] weeks gestation; and Ptyalism on their problem list.  Patient reports fatigue.  Contractions: Not present. Vag. Bleeding: None.  Movement: Present. Denies leaking of fluid.   The following portions of the patient's history were reviewed and updated as appropriate: allergies, current medications, past family history, past medical history, past social history, past surgical history and problem list.   Objective:   Vitals:   04/03/19 0926  BP: 110/74  Pulse: 94  Weight: 174 lb (78.9 kg)    Fetal Status: Fetal Heart Rate (bpm): 152 Fundal Height: 29 cm Movement: Present     General:  Alert, oriented and cooperative. Patient is in no acute distress.  Skin: Skin is warm and dry. No rash noted.   Cardiovascular: Normal heart rate noted  Respiratory: Normal respiratory effort, no problems with respiration noted  Abdomen: Soft, gravid, appropriate for gestational age.  Pain/Pressure: Absent     Pelvic: Cervical exam deferred        Extremities: Normal range of motion.  Edema: None  Mental Status: Normal mood and affect. Normal behavior. Normal judgment and thought content.   Assessment and Plan:  Pregnancy: G3P2002 at [redacted]w[redacted]d 1. Supervision of other normal pregnancy, antepartum - patient doing well, reports occasional fatigue especially after glucola  - Routine prenatal care - Anticipatory guidance on upcoming appointments - patient had water birth in past and would like water birth, discussed with patient that waterbirth is back and discussed process  - Educated on GTT  and what abnormal results mean  - 2Hr GTT w/ 1 Hr Carpenter 75 g - HIV antibody (with reflex) - CBC - RPR - Tdap vaccine greater than or equal to 7yo IM  2. Multigravida of advanced maternal age in second trimester   Preterm labor symptoms and general obstetric precautions including but not limited to vaginal bleeding, contractions, leaking of fluid and fetal movement were reviewed in detail with the patient. Please refer to After Visit Summary for other counseling recommendations.   Return in about 3 weeks (around 04/24/2019) for ROB-mychart.  Future Appointments  Date Time Provider Department Center  04/24/2019  8:55 AM Kendell Bane C[facility]  04/27/2019  9:30 AM WH-MFC NURSE WH-MFC MFC-US  04/27/2019  9:30 AM WH-MFC [place] 5 WH-MFCUS MFC-US  05/08/2019  8:55 AM Leftwich-Kirby, Wilmer Floor, CNM CWH[facility]WK[facility]  05/22/2019  8:55 AM Leftwich-Kirby, Wilmer Floor, CNM [facility]    Sharyon Cable, CNM

## 2019-04-04 DIAGNOSIS — O99013 Anemia complicating pregnancy, third trimester: Secondary | ICD-10-CM | POA: Insufficient documentation

## 2019-04-04 LAB — 2HR GTT W 1 HR, CARPENTER, 75 G
Glucose, 1 Hr, Gest: 125 mg/dL (ref 65–179)
Glucose, 2 Hr, Gest: 129 mg/dL (ref 65–152)
Glucose, Fasting, Gest: 75 mg/dL (ref 65–91)

## 2019-04-04 LAB — HIV ANTIBODY (ROUTINE TESTING W REFLEX): HIV 1&2 Ab, 4th Generation: NONREACTIVE

## 2019-04-04 LAB — CBC
HCT: 27.7 % — ABNORMAL LOW (ref 35.0–45.0)
Hemoglobin: 8.8 g/dL — ABNORMAL LOW (ref 11.7–15.5)
MCH: 26.3 pg — ABNORMAL LOW (ref 27.0–33.0)
MCHC: 31.8 g/dL — ABNORMAL LOW (ref 32.0–36.0)
MCV: 82.7 fL (ref 80.0–100.0)
MPV: 10.3 fL (ref 7.5–12.5)
Platelets: 212 10*3/uL (ref 140–400)
RBC: 3.35 10*6/uL — ABNORMAL LOW (ref 3.80–5.10)
RDW: 13.6 % (ref 11.0–15.0)
WBC: 3.9 10*3/uL (ref 3.8–10.8)

## 2019-04-04 LAB — RPR: RPR Ser Ql: NONREACTIVE

## 2019-04-04 MED ORDER — FERROUS SULFATE 325 (65 FE) MG PO TABS: 325.0000 mg | ORAL_TABLET | Freq: Two times a day (BID) | ORAL | 2 refills | Status: DC

## 2019-04-04 NOTE — Addendum Note (Signed)
Addended by: Sharyon Cable on: 04/04/2019 01:58 PM   Modules accepted: Orders

## 2019-04-24 ENCOUNTER — Telehealth (INDEPENDENT_AMBULATORY_CARE_PROVIDER_SITE_OTHER): Payer: Medicaid Other | Admitting: Advanced Practice Midwife

## 2019-04-24 DIAGNOSIS — R102 Pelvic and perineal pain: Secondary | ICD-10-CM

## 2019-04-24 DIAGNOSIS — Z3480 Encounter for supervision of other normal pregnancy, unspecified trimester: Secondary | ICD-10-CM

## 2019-04-24 DIAGNOSIS — O26899 Other specified pregnancy related conditions, unspecified trimester: Secondary | ICD-10-CM

## 2019-04-24 NOTE — Progress Notes (Signed)
   TELEHEALTH VIRTUAL OBSTETRICS VISIT ENCOUNTER NOTE  I connected with Kathleen Morrison on 04/24/19 at  8:55 AM EDT by telephone at home and verified that I am speaking with the correct person using two identifiers.   I discussed the limitations, risks, security and privacy concerns of performing an evaluation and management service by telephone and the availability of in person appointments. I also discussed with the patient that there may be a patient responsible charge related to this service. The patient expressed understanding and agreed to proceed.  Subjective:  Kathleen Morrison is a 34 y.o. G3P2002 at [redacted]w[redacted]d being followed for ongoing prenatal care.  She is currently monitored for the following issues for this low-risk pregnancy and has Supervision of other normal pregnancy, antepartum; Gastroesophageal reflux disease with esophagitis; Multigravida of advanced maternal age in second trimester; Nausea and vomiting during pregnancy prior to [redacted] weeks gestation; Ptyalism; and Anemia during pregnancy in third trimester on their problem list.  Patient reports sharp groin pain with movement. Reports fetal movement. Denies any contractions, bleeding or leaking of fluid.   The following portions of the patient's history were reviewed and updated as appropriate: allergies, current medications, past family history, past medical history, past social history, past surgical history and problem list.   Objective:   General:  Alert, oriented and cooperative.   Mental Status: Normal mood and affect perceived. Normal judgment and thought content.  Rest of physical exam deferred due to type of encounter  Assessment and Plan:  Pregnancy: G3P2002 at [redacted]w[redacted]d 1. Supervision of other normal pregnancy, antepartum --Pt reports good fetal movement, denies cramping, LOF, or vaginal bleeding --BP reviewed and wnl today --Anticipatory guidance about next visits/weeks of pregnancy given. --Desires waterbirth,  plans to attend next class (does not have to have since she attended class with previous birth). Needs consent at 36 week visit.  --Next visit in 4 weeks in the office at 36 weeks.   2. Pain of round ligament affecting pregnancy, antepartum --Rest/ice/heat/warm bath/Tylenol/pregnancy support belt  Preterm labor symptoms and general obstetric precautions including but not limited to vaginal bleeding, contractions, leaking of fluid and fetal movement were reviewed in detail with the patient.  I discussed the assessment and treatment plan with the patient. The patient was provided an opportunity to ask questions and all were answered. The patient agreed with the plan and demonstrated an understanding of the instructions. The patient was advised to call back or seek an in-person office evaluation/go to MAU at [facility] for any urgent or concerning symptoms. Please refer to After Visit Summary for other counseling recommendations.   I provided 10 minutes of face-to-face time during this encounter.  Return in about 4 weeks (around 05/22/2019).  Future Appointments  Date Time Provider Department Center  04/27/2019  9:30 AM WH-MFC NURSE WH-MFC MFC-US  04/27/2019  9:30 AM WH-MFC [place] 5 WH-MFCUS MFC-US  05/08/2019  8:55 AM Leftwich-Kirby, Wilmer Floor, CNM C[facility]  05/22/2019  8:55 AM Leftwich-Kirby, Wilmer Floor, CNM [facility]    Sharen Counter, CNM [facility] [employer], [facility]

## 2019-04-27 ENCOUNTER — Ambulatory Visit (HOSPITAL_COMMUNITY)
Admission: RE | Admit: 2019-04-27 | Discharge: 2019-04-27 | Disposition: A | Discharge status: Home / Self Care | Payer: Medicaid Other | Source: Ambulatory Visit | Attending: Obstetrics | Admitting: Obstetrics

## 2019-04-27 ENCOUNTER — Ambulatory Visit (HOSPITAL_COMMUNITY): Payer: Medicaid Other | Admitting: *Deleted

## 2019-04-27 ENCOUNTER — Encounter (HOSPITAL_COMMUNITY): Payer: Self-pay

## 2019-04-27 ENCOUNTER — Other Ambulatory Visit: Payer: Self-pay

## 2019-04-27 VITALS — BP 121/75 | HR 102 | Temp 97.4°F

## 2019-04-27 DIAGNOSIS — O09523 Supervision of elderly multigravida, third trimester: Secondary | ICD-10-CM | POA: Diagnosis not present

## 2019-04-27 DIAGNOSIS — Z3A23 23 weeks gestation of pregnancy: Secondary | ICD-10-CM | POA: Diagnosis not present

## 2019-04-27 DIAGNOSIS — O99013 Anemia complicating pregnancy, third trimester: Secondary | ICD-10-CM | POA: Diagnosis not present

## 2019-04-27 DIAGNOSIS — O09529 Supervision of elderly multigravida, unspecified trimester: Secondary | ICD-10-CM | POA: Insufficient documentation

## 2019-05-08 ENCOUNTER — Telehealth: Payer: Medicaid Other | Admitting: Advanced Practice Midwife

## 2019-05-16 ENCOUNTER — Encounter (INDEPENDENT_AMBULATORY_CARE_PROVIDER_SITE_OTHER): Payer: Self-pay

## 2019-05-22 ENCOUNTER — Other Ambulatory Visit (HOSPITAL_COMMUNITY)
Admission: RE | Admit: 2019-05-22 | Discharge: 2019-05-22 | Disposition: A | Discharge status: Home / Self Care | Payer: Medicaid Other | Source: Ambulatory Visit | Attending: Advanced Practice Midwife | Admitting: Advanced Practice Midwife

## 2019-05-22 ENCOUNTER — Other Ambulatory Visit: Payer: Self-pay

## 2019-05-22 ENCOUNTER — Ambulatory Visit (INDEPENDENT_AMBULATORY_CARE_PROVIDER_SITE_OTHER): Payer: Medicaid Other | Admitting: Advanced Practice Midwife

## 2019-05-22 VITALS — BP 112/70 | HR 106 | Temp 98.3°F | Wt 181.0 lb

## 2019-05-22 DIAGNOSIS — Z3480 Encounter for supervision of other normal pregnancy, unspecified trimester: Secondary | ICD-10-CM | POA: Insufficient documentation

## 2019-05-22 NOTE — Progress Notes (Signed)
   PRENATAL VISIT NOTE  Subjective:  Kathleen Morrison is a 34 y.o. G3P2002 at [redacted]w[redacted]d being seen today for ongoing prenatal care.  She is currently monitored for the following issues for this low-risk pregnancy and has Supervision of other normal pregnancy, antepartum; Gastroesophageal reflux disease with esophagitis; Multigravida of advanced maternal age in second trimester; Nausea and vomiting during pregnancy prior to [redacted] weeks gestation; Ptyalism; and Anemia during pregnancy in third trimester on their problem list.  Patient reports occasional contractions.  Contractions: Not present. Vag. Bleeding: None.  Movement: Present. Denies leaking of fluid.   The following portions of the patient's history were reviewed and updated as appropriate: allergies, current medications, past family history, past medical history, past social history, past surgical history and problem list.   Objective:   Vitals:   05/22/19 0902  BP: 112/70  Pulse: (!) 106  Temp: 98.3 F (36.8 C)  Weight: 181 lb (82.1 kg)    Fetal Status: Fetal Heart Rate (bpm): 158   Movement: Present     General:  Alert, oriented and cooperative. Patient is in no acute distress.  Skin: Skin is warm and dry. No rash noted.   Cardiovascular: Normal heart rate noted  Respiratory: Normal respiratory effort, no problems with respiration noted  Abdomen: Soft, gravid, appropriate for gestational age.  Pain/Pressure: Absent     Pelvic: Cervical exam performed in the presence of a chaperone  1/50/-2      Extremities: Normal range of motion.  Edema: None  Mental Status: Normal mood and affect. Normal behavior. Normal judgment and thought content.   Assessment and Plan:  Pregnancy: G3P2002 at [redacted]w[redacted]d 1. Supervision of other normal pregnancy, antepartum --Anticipatory guidance about next visits/weeks of pregnancy given. --Next visit in 2 weeks virtual - Culture, beta strep (group b only) - Cervicovaginal ancillary only( [place])   Preterm labor symptoms and general obstetric precautions including but not limited to vaginal bleeding, contractions, leaking of fluid and fetal movement were reviewed in detail with the patient. Please refer to After Visit Summary for other counseling recommendations.   Return in about 2 weeks (around 06/05/2019).    No future appointments.  Sharen Counter, CNM

## 2019-05-22 NOTE — Patient Instructions (Addendum)
Labor Precautions Reasons to come to MAU at [facility]:  1.  Contractions are  5 minutes apart or less, each last 1 minute, these have been going on for 1-2 hours, and you cannot walk or talk during them 2.  You have a large gush of fluid, or a trickle of fluid that will not stop and you have to wear a pad 3.  You have bleeding that is bright red, heavier than spotting--like menstrual bleeding (spotting can be normal in early labor or after a check of your cervix) 4.  You do not feel the baby moving like he/she normally does  Things to Try After 37 weeks to Encourage Labor/Get Ready for Labor:   1.  Try the Miles Circuit at www.milescircuit.com daily to improve baby's position and encourage the onset of labor.  2. Walk a little and rest a little every day.  Change positions often.  3. Cervical Ripening: May try one or both a. Red Raspberry Leaf capsules or tea:  two 300mg or 400mg tablets with each meal, 2-3 times a day, or 1-3 cups of tea daily  Potential Side Effects Of Raspberry Leaf:  Most women do not experience any side effects from drinking raspberry leaf tea. However, nausea and loose stools are possible   b. Evening Primrose Oil capsules: may take 1 to 3 capsules daily. May also prick one to release the oil and insert it into your vagina at night.  Some of the potential side effects:  Upset stomach  Loose stools or diarrhea  Headaches  Nausea  4. Sex (and especially sex with orgasm) can also help the cervix ripen and encourage labor onset.   

## 2019-05-23 LAB — CERVICOVAGINAL ANCILLARY ONLY
Chlamydia: NEGATIVE
Comment: NEGATIVE
Comment: NORMAL
Neisseria Gonorrhea: NEGATIVE

## 2019-05-25 LAB — CULTURE, BETA STREP (GROUP B ONLY)
MICRO NUMBER:: 10384067
SPECIMEN QUALITY:: ADEQUATE

## 2019-05-25 LAB — OB RESULTS CONSOLE GBS: GBS: NEGATIVE

## 2019-06-05 ENCOUNTER — Telehealth (INDEPENDENT_AMBULATORY_CARE_PROVIDER_SITE_OTHER): Payer: Medicaid Other | Admitting: Certified Nurse Midwife

## 2019-06-05 ENCOUNTER — Encounter: Payer: Self-pay | Admitting: Certified Nurse Midwife

## 2019-06-05 DIAGNOSIS — O99013 Anemia complicating pregnancy, third trimester: Secondary | ICD-10-CM

## 2019-06-05 DIAGNOSIS — Z3480 Encounter for supervision of other normal pregnancy, unspecified trimester: Secondary | ICD-10-CM

## 2019-06-05 DIAGNOSIS — O09523 Supervision of elderly multigravida, third trimester: Secondary | ICD-10-CM

## 2019-06-05 NOTE — Patient Instructions (Signed)
Cervical Ripening: May try one or both  Red Raspberry Leaf capsules:  two 300mg  or 400mg  tablets with each meal, 2-3 times a day  Potential Side Effects Of Raspberry Leaf:  Most women do not experience any side effects from drinking raspberry leaf tea. However, nausea and loose stools are possible   Evening Primrose Oil capsules: may take 1 to 3 capsules daily. May also prick one to release the oil and insert it into your vagina at night.  Some of the potential side effects:  Upset stomach  Loose stools or diarrhea  Headaches  Nausea:    

## 2019-06-05 NOTE — Progress Notes (Signed)
   OBSTETRICS PRENATAL VIRTUAL VISIT ENCOUNTER NOTE  Provider location: Center for [facility] [place]   I connected with [employer] on 06/05/19 at 10:59 AM EDT by MyChart Video Encounter at home and verified that I am speaking with the correct person using two identifiers.   I discussed the limitations, risks, security and privacy concerns of performing an evaluation and management service virtually and the availability of in person appointments. I also discussed with the patient that there may be a patient responsible charge related to this service. The patient expressed understanding and agreed to proceed. Subjective:  Kathleen Morrison is a 34 y.o. G3P2002 at [redacted]w[redacted]d being seen today for ongoing prenatal care.  She is currently monitored for the following issues for this low-risk pregnancy and has Supervision of other normal pregnancy, antepartum; Gastroesophageal reflux disease with esophagitis; Multigravida of advanced maternal age in second trimester; Nausea and vomiting during pregnancy prior to [redacted] weeks gestation; Ptyalism; and Anemia during pregnancy in third trimester on their problem list.  Patient reports no complaints.  Contractions: Not present. Vag. Bleeding: None.  Movement: Present. Denies any leaking of fluid.   The following portions of the patient's history were reviewed and updated as appropriate: allergies, current medications, past family history, past medical history, past social history, past surgical history and problem list.   Objective:  There were no vitals filed for this visit.  Fetal Status:     Movement: Present     General:  Alert, oriented and cooperative. Patient is in no acute distress.  Respiratory: Normal respiratory effort, no problems with respiration noted  Mental Status: Normal mood and affect. Normal behavior. Normal judgment and thought content.  Rest of physical exam deferred due to type of encounter  Imaging: No results  found.  Assessment and Plan:  Pregnancy: G3P2002 at [redacted]w[redacted]d 1. Supervision of other normal pregnancy, antepartum - Patient doing well, no complaints - denies frequent contractions at this time, patient reports walking a lot at home but not doing anything else to stimulate labor or contractions until mother arrives from out of town  - educated and discussed use of EPO, RRT, miles circuit to induce contractions, patient reports using midwife's brew with last pregnancies that put her into labor  - routine prenatal care - anticipatory guidance on upcoming appointments with next being in person, discussed with patient we can perform membrane sweep at next appointment if patient willing, patient verbalizes understanding.   2. Anemia during pregnancy in third trimester - continue supplementation   3. Multigravida of advanced maternal age in third trimester   Term labor symptoms and general obstetric precautions including but not limited to vaginal bleeding, contractions, leaking of fluid and fetal movement were reviewed in detail with the patient. I discussed the assessment and treatment plan with the patient. The patient was provided an opportunity to ask questions and all were answered. The patient agreed with the plan and demonstrated an understanding of the instructions. The patient was advised to call back or seek an in-person office evaluation/go to [facility]U at [facility] for any urgent or concerning symptoms. Please refer to After Visit Summary for other counseling recommendations.   I provided 12 minutes of face-to-face time during this encounter.  Return in about 1 week (around 06/12/2019) for ROB-in person.  Sharyon Cable, CNM Center for [employer], [facility]

## 2019-06-12 ENCOUNTER — Other Ambulatory Visit: Payer: Self-pay

## 2019-06-12 ENCOUNTER — Other Ambulatory Visit: Payer: Self-pay | Admitting: Women's Health

## 2019-06-12 ENCOUNTER — Ambulatory Visit (INDEPENDENT_AMBULATORY_CARE_PROVIDER_SITE_OTHER): Payer: Medicaid Other | Admitting: Women's Health

## 2019-06-12 VITALS — BP 132/80 | HR 91 | Temp 98.3°F | Wt 186.0 lb

## 2019-06-12 DIAGNOSIS — Z3A39 39 weeks gestation of pregnancy: Secondary | ICD-10-CM | POA: Diagnosis not present

## 2019-06-12 DIAGNOSIS — O09523 Supervision of elderly multigravida, third trimester: Secondary | ICD-10-CM

## 2019-06-12 DIAGNOSIS — D649 Anemia, unspecified: Secondary | ICD-10-CM | POA: Diagnosis not present

## 2019-06-12 DIAGNOSIS — O99013 Anemia complicating pregnancy, third trimester: Secondary | ICD-10-CM | POA: Diagnosis not present

## 2019-06-12 DIAGNOSIS — Z3480 Encounter for supervision of other normal pregnancy, unspecified trimester: Secondary | ICD-10-CM

## 2019-06-12 NOTE — Patient Instructions (Addendum)
Maternity Assessment Unit (MAU)  The Maternity Assessment Unit (MAU) is located at the [facility] at [facility]. The address is: [address], [address], [place], [place] [address]. Please see map below for additional directions.    The Maternity Assessment Unit is designed to help you during your pregnancy, and for up to 6 weeks after delivery, with any pregnancy- or postpartum-related emergencies, if you think you are in labor, or if your water has broken. For example, if you experience nausea and vomiting, vaginal bleeding, severe abdominal or pelvic pain, elevated blood pressure or other problems related to your pregnancy or postpartum time, please come to the Maternity Assessment Unit for assistance.        Signs and Symptoms of Labor Labor is your body's natural process of moving your baby, placenta, and umbilical cord out of your uterus. The process of labor usually starts when your baby is full-term, between 37 and 40 weeks of pregnancy. How will I know when I am close to going into labor? As your body prepares for labor and the birth of your baby, you may notice the following symptoms in the weeks and days before true labor starts:  Having a strong desire to get your home ready to receive your new baby. This is called nesting. Nesting may be a sign that labor is approaching, and it may occur several weeks before birth. Nesting may involve cleaning and organizing your home.  Passing a small amount of thick, bloody mucus out of your vagina (normal bloody show or losing your mucus plug). This may happen more than a week before labor begins, or it might occur right before labor begins as the opening of the cervix starts to widen (dilate). For some women, the entire mucus plug passes at once. For others, smaller portions of the mucus plug may gradually pass over several days.  Your baby moving (dropping) lower in your pelvis to get into position for birth  (lightening). When this happens, you may feel more pressure on your bladder and pelvic bone and less pressure on your ribs. This may make it easier to breathe. It may also cause you to need to urinate more often and have problems with bowel movements.  Having "practice contractions" (Braxton Hicks contractions) that occur at irregular (unevenly spaced) intervals that are more than 10 minutes apart. This is also called false labor. False labor contractions are common after exercise or sexual activity, and they will stop if you change position, rest, or drink fluids. These contractions are usually mild and do not get stronger over time. They may feel like: ? A backache or back pain. ? Mild cramps, similar to menstrual cramps. ? Tightening or pressure in your abdomen. Other early symptoms that labor may be starting soon include:  Nausea or loss of appetite.  Diarrhea.  Having a sudden burst of energy, or feeling very tired.  Mood changes.  Having trouble sleeping. How will I know when labor has begun? Signs that true labor has begun may include:  Having contractions that come at regular (evenly spaced) intervals and increase in intensity. This may feel like more intense tightening or pressure in your abdomen that moves to your back. ? Contractions may also feel like rhythmic pain in your upper thighs or back that comes and goes at regular intervals. ? For first-time mothers, this change in intensity of contractions often occurs at a more gradual pace. ? Women who have given birth before may notice   rapid progression of contraction changes.  Having a feeling of pressure in the vaginal area.  Your water breaking (rupture of membranes). This is when the sac of fluid that surrounds your baby breaks. When this happens, you will notice fluid leaking from your vagina. This may be clear or blood-tinged. Labor usually starts within 24 hours of your water breaking, but it may take longer to  begin. ? Some women notice this as a gush of fluid. ? Others notice that their underwear repeatedly becomes damp. Follow these instructions at home:   When labor starts, or if your water breaks, call your health care provider or nurse care line. Based on your situation, they will determine when you should go in for an exam.  When you are in early labor, you may be able to rest and manage symptoms at home. Some strategies to try at home include: ? Breathing and relaxation techniques. ? Taking a warm bath or shower. ? Listening to music. ? Using a heating pad on the lower back for pain. If you are directed to use heat:  Place a towel between your skin and the heat source.  Leave the heat on for 20-30 minutes.  Remove the heat if your skin turns bright red. This is especially important if you are unable to feel pain, heat, or cold. You may have a greater risk of getting burned. Get help right away if:  You have painful, regular contractions that are 5 minutes apart or less.  Labor starts before you are [redacted] weeks along in your pregnancy.  You have a fever.  You have a headache that does not go away.  You have bright red blood coming from your vagina.  You do not feel your baby moving.  You have a sudden onset of: ? Severe headache with vision problems. ? Nausea, vomiting, or diarrhea. ? Chest pain or shortness of breath. These symptoms may be an emergency. If your health care provider recommends that you go to the hospital or birth center where you plan to deliver, do not drive yourself. Have someone else drive you, or call emergency services (911 in the [place] Summary  Labor is your body's natural process of moving your baby, placenta, and umbilical cord out of your uterus.  The process of labor usually starts when your baby is full-term, between 74 and 40 weeks of pregnancy.  When labor starts, or if your water breaks, call your health care provider or nurse care line. Based  on your situation, they will determine when you should go in for an exam. This information is not intended to replace advice given to you by your health care provider. Make sure you discuss any questions you have with your health care provider. Document Revised: 10/18/2016 Document Reviewed: 06/25/2016 Elsevier Patient Education  [place] Induction of Labor Process for [place] and [place]  In Fall 2020 [place] and [place] changed it's process for scheduling inductions of labor to create more induction slots and to make sure patients get COVID-19 testing in advance. After you have been tested you need to quarantine so that you do not get infected after your test. You should not go anywhere after your test except necessary medical appointments.  You have been scheduled for induction of labor on 06/23/2019. Although you may have a specific time listed on your After Visit Summary or MyChart, we cannot predict when your room will be  available. Please disregard this time. A Labor and Delivery staff member will call you on the day that you are scheduled when your room is available. You will need to arrive within one hour of being called. If you do not arrive within this time frame, the next person on the list will be called in and you will move down the list. You may eat a light meal before coming to the hospital. If you go into labor, think your water has broken, experience bright red bleeding or don't feel your baby moving as much as usual before your induction, please call your Ob/Gyn's office or come to Entrance C, Maternity Assessment Unit for evaluation.  Thank you,  Center for [employer]

## 2019-06-12 NOTE — Progress Notes (Signed)
Induction Assessment Scheduling Form: Fax to Women's L&D:  [phone] Route to [facility]-2S Labor Delivery   [place]                                                                                   DOB:  February 22, 1985                                                            MRN:  [id number]  Phone:  Home Phone [phone]  Mobile [phone]    Provider:  [facility] (Faculty Practice)  Admission Date/Time:  06/23/2019 GP:  [id number]     Gestational age on admission:  [redacted]w[redacted]d                                                Estimated Date of Delivery: 06/16/19  Dating Criteria: LMP c/w early [place]  Filed Weights   06/12/19 1100  Weight: 186 lb (84.4 kg)    GBS:   negative HIV:  NON-REACTIVE (03/02 0910)  Medical Indications for induction:  Post-dates Scheduling Provider Signature:  Kathleen Ponto, NP         Method of induction(proposed):  Cytotec   Scheduling Provider Signature:  Kathleen Ponto, NP                                            Today's Date:  06/12/2019

## 2019-06-12 NOTE — Progress Notes (Addendum)
Subjective:  Kathleen Morrison is a 34 y.o. G3P2002 at [redacted]w[redacted]d being seen today for ongoing prenatal care.  She is currently monitored for the following issues for this low-risk pregnancy and has Supervision of other normal pregnancy, antepartum; Gastroesophageal reflux disease with esophagitis; AMA (advanced maternal age) multigravida 35+; Nausea and vomiting during pregnancy prior to [redacted] weeks gestation; Ptyalism; and Anemia during pregnancy in third trimester on their problem list.  Patient reports no complaints.  Contractions: Not present. Vag. Bleeding: None.  Movement: Present. Denies leaking of fluid.   The following portions of the patient's history were reviewed and updated as appropriate: allergies, current medications, past family history, past medical history, past social history, past surgical history and problem list. Problem list updated.  Objective:   Vitals:   06/12/19 1100  BP: 132/80  Pulse: 91  Temp: 98.3 F (36.8 C)  Weight: 186 lb (84.4 kg)    Fetal Status: Fetal Heart Rate (bpm): 141   Movement: Present     General:  Alert, oriented and cooperative. Patient is in no acute distress.  Skin: Skin is warm and dry. No rash noted.   Cardiovascular: Normal heart rate noted  Respiratory: Normal respiratory effort, no problems with respiration noted  Abdomen: Soft, gravid, appropriate for gestational age. Pain/Pressure: Absent     Pelvic: Vag. Bleeding: None Vag D/C Character: Thin   Cervical exam deferred        Extremities: Normal range of motion.  Edema: Trace  Mental Status: Normal mood and affect. Normal behavior. Normal judgment and thought content.   Urinalysis:      Assessment and Plan:  Pregnancy: G3P2002 at [redacted]w[redacted]d  1. 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 Multigravida of advanced maternal age in third  trimester -pt age 89 at time of delivery  3. Anemia during pregnancy in third trimester -CBC today -pt reports taking iron daily   Term labor symptoms and general obstetric precautions including but not limited to vaginal bleeding, contractions, leaking of fluid and fetal movement were reviewed in detail with the patient. Please refer to After Visit Summary for other counseling recommendations.  Return in about 6 days (around 06/18/2019) for in-person ROB/NST.   Carless Slatten, Odie Sera, NP

## 2019-06-12 NOTE — Progress Notes (Signed)
IOL orders entered.  Marylen Ponto, NP  11:51 AM 06/12/2019

## 2019-06-13 ENCOUNTER — Encounter: Payer: Medicaid Other | Admitting: Obstetrics and Gynecology

## 2019-06-13 ENCOUNTER — Telehealth (HOSPITAL_COMMUNITY): Payer: Self-pay | Admitting: *Deleted

## 2019-06-13 LAB — CBC
HCT: 28.5 % — ABNORMAL LOW (ref 35.0–45.0)
Hemoglobin: 8.3 g/dL — ABNORMAL LOW (ref 11.7–15.5)
MCH: 23.1 pg — ABNORMAL LOW (ref 27.0–33.0)
MCHC: 29.1 g/dL — ABNORMAL LOW (ref 32.0–36.0)
MCV: 79.2 fL — ABNORMAL LOW (ref 80.0–100.0)
MPV: 10.9 fL (ref 7.5–12.5)
Platelets: 202 10*3/uL (ref 140–400)
RBC: 3.6 10*6/uL — ABNORMAL LOW (ref 3.80–5.10)
RDW: 16.1 % — ABNORMAL HIGH (ref 11.0–15.0)
WBC: 5.2 10*3/uL (ref 3.8–10.8)

## 2019-06-13 NOTE — Telephone Encounter (Signed)
Preadmission screen  

## 2019-06-14 ENCOUNTER — Telehealth (HOSPITAL_COMMUNITY): Payer: Self-pay | Admitting: *Deleted

## 2019-06-14 NOTE — Telephone Encounter (Signed)
Preadmission screen  

## 2019-06-18 ENCOUNTER — Other Ambulatory Visit: Payer: Self-pay | Admitting: Family Medicine

## 2019-06-18 ENCOUNTER — Ambulatory Visit (INDEPENDENT_AMBULATORY_CARE_PROVIDER_SITE_OTHER): Payer: Medicaid Other | Admitting: Obstetrics & Gynecology

## 2019-06-18 ENCOUNTER — Other Ambulatory Visit: Payer: Self-pay

## 2019-06-18 VITALS — BP 120/78 | HR 111 | Wt 183.0 lb

## 2019-06-18 DIAGNOSIS — O09523 Supervision of elderly multigravida, third trimester: Secondary | ICD-10-CM

## 2019-06-18 DIAGNOSIS — O480 Post-term pregnancy: Secondary | ICD-10-CM | POA: Diagnosis not present

## 2019-06-18 DIAGNOSIS — Z3480 Encounter for supervision of other normal pregnancy, unspecified trimester: Secondary | ICD-10-CM

## 2019-06-18 DIAGNOSIS — Z3A40 40 weeks gestation of pregnancy: Secondary | ICD-10-CM

## 2019-06-18 NOTE — Patient Instructions (Signed)

## 2019-06-18 NOTE — Progress Notes (Signed)
   PRENATAL VISIT NOTE  Subjective:  Kathleen Morrison is a 34 y.o. G3P2002 at [redacted]w[redacted]d being seen today for ongoing prenatal care.  She is currently monitored for the following issues for this low-risk pregnancy and has Supervision of other normal pregnancy, antepartum; Gastroesophageal reflux disease with esophagitis; AMA (advanced maternal age) multigravida 35+; Nausea and vomiting during pregnancy prior to [redacted] weeks gestation; Ptyalism; and Anemia during pregnancy in third trimester on their problem list.  Patient reports occasional contractions.  Contractions: Not present. Vag. Bleeding: None.  Movement: Present. Denies leaking of fluid.   The following portions of the patient's history were reviewed and updated as appropriate: allergies, current medications, past family history, past medical history, past social history, past surgical history and problem list.   Objective:   Vitals:   06/18/19 1116  BP: 120/78  Pulse: (!) 111  Weight: 183 lb (83 kg)    Fetal Status:     Movement: Present  Presentation: Vertex  General:  Alert, oriented and cooperative. Patient is in no acute distress.  Skin: Skin is warm and dry. No rash noted.   Cardiovascular: Normal heart rate noted  Respiratory: Normal respiratory effort, no problems with respiration noted  Abdomen: Soft, gravid, appropriate for gestational age.  Pain/Pressure: Absent     Pelvic: Cervical exam performed in the presence of a chaperone Dilation: 1 Effacement (%): 50 Station: -2  Extremities: Normal range of motion.  Edema: Trace  Mental Status: Normal mood and affect. Normal behavior. Normal judgment and thought content.   Assessment and Plan:  Pregnancy: G3P2002 at [redacted]w[redacted]d 1. [redacted] weeks gestation of pregnancy 2. Multigravida of advanced maternal age in third trimester 3. Supervision of other normal pregnancy, antepartum Fetal nonstress test performed today was reviewed and was found to be reactive.  AFI was also normal.  Already  scheduled for IOL at 41 weeks, orders have been signed and held.  Offered outpatient foley placement for cervical ripening, she declined this option. She was told to expect a call from [facility] L&D with further instructions about her pre-admission COVID screening and any further instructions about the induction of labor. She was told that the only scheduled time slot was midnight, patients are called in during the morning between 6:30am -9 am to come in for their induction as soon as the rooms and staff are ready for them.   Term labor symptoms and general obstetric precautions including but not limited to vaginal bleeding, contractions, leaking of fluid and fetal movement were reviewed in detail with the patient. Please refer to After Visit Summary for other counseling recommendations.   Return for Postpartum check.  Future Appointments  Date Time Provider Department Center  06/21/2019  9:30 AM [facility]-SCREENING [facility] None  06/23/2019  8:00 AM MC-LD SCHED ROOM [facility]INDC None  07/24/2019  8:35 AM Rasch, Harolyn Rutherford, NP [facility]    Jaynie Collins, MD

## 2019-06-19 ENCOUNTER — Telehealth (HOSPITAL_COMMUNITY): Payer: Self-pay | Admitting: *Deleted

## 2019-06-19 NOTE — Telephone Encounter (Signed)
Preadmission screen  

## 2019-06-21 ENCOUNTER — Other Ambulatory Visit (HOSPITAL_COMMUNITY)
Admission: RE | Admit: 2019-06-21 | Discharge: 2019-06-21 | Disposition: A | Discharge status: Home / Self Care | Payer: Medicaid Other | Source: Ambulatory Visit | Attending: Obstetrics and Gynecology | Admitting: Obstetrics and Gynecology

## 2019-06-21 ENCOUNTER — Telehealth (HOSPITAL_COMMUNITY): Payer: Self-pay | Admitting: *Deleted

## 2019-06-21 DIAGNOSIS — Z20822 Contact with and (suspected) exposure to covid-19: Secondary | ICD-10-CM | POA: Insufficient documentation

## 2019-06-21 DIAGNOSIS — Z01812 Encounter for preprocedural laboratory examination: Secondary | ICD-10-CM | POA: Insufficient documentation

## 2019-06-21 LAB — SARS CORONAVIRUS 2 (TAT 6-24 HRS): SARS Coronavirus 2: NEGATIVE

## 2019-06-21 NOTE — Telephone Encounter (Signed)
Preadmission screen  

## 2019-06-23 ENCOUNTER — Encounter (HOSPITAL_COMMUNITY): Payer: Self-pay | Admitting: Anesthesiology

## 2019-06-23 ENCOUNTER — Inpatient Hospital Stay (HOSPITAL_COMMUNITY)
Admission: AD | Admit: 2019-06-23 | Discharge: 2019-06-25 | DRG: 806 | Disposition: A | Discharge status: Home / Self Care | Payer: Medicaid Other | Attending: Obstetrics and Gynecology | Admitting: Obstetrics and Gynecology

## 2019-06-23 ENCOUNTER — Inpatient Hospital Stay (HOSPITAL_COMMUNITY): Payer: Medicaid Other

## 2019-06-23 ENCOUNTER — Encounter (HOSPITAL_COMMUNITY): Payer: Self-pay | Admitting: Obstetrics and Gynecology

## 2019-06-23 DIAGNOSIS — Z20822 Contact with and (suspected) exposure to covid-19: Secondary | ICD-10-CM | POA: Diagnosis present

## 2019-06-23 DIAGNOSIS — Z7982 Long term (current) use of aspirin: Secondary | ICD-10-CM

## 2019-06-23 DIAGNOSIS — D62 Acute posthemorrhagic anemia: Secondary | ICD-10-CM | POA: Diagnosis not present

## 2019-06-23 DIAGNOSIS — O9962 Diseases of the digestive system complicating childbirth: Secondary | ICD-10-CM | POA: Diagnosis present

## 2019-06-23 DIAGNOSIS — O09529 Supervision of elderly multigravida, unspecified trimester: Secondary | ICD-10-CM

## 2019-06-23 DIAGNOSIS — Z3480 Encounter for supervision of other normal pregnancy, unspecified trimester: Secondary | ICD-10-CM

## 2019-06-23 DIAGNOSIS — O9081 Anemia of the puerperium: Secondary | ICD-10-CM | POA: Diagnosis not present

## 2019-06-23 DIAGNOSIS — K2100 Gastro-esophageal reflux disease with esophagitis, without bleeding: Secondary | ICD-10-CM | POA: Diagnosis present

## 2019-06-23 DIAGNOSIS — O480 Post-term pregnancy: Principal | ICD-10-CM | POA: Diagnosis present

## 2019-06-23 DIAGNOSIS — O99013 Anemia complicating pregnancy, third trimester: Secondary | ICD-10-CM

## 2019-06-23 DIAGNOSIS — Z3A41 41 weeks gestation of pregnancy: Secondary | ICD-10-CM

## 2019-06-23 LAB — CBC
HCT: 30.7 % — ABNORMAL LOW (ref 36.0–46.0)
Hemoglobin: 8.9 g/dL — ABNORMAL LOW (ref 12.0–15.0)
MCH: 23.5 pg — ABNORMAL LOW (ref 26.0–34.0)
MCHC: 29 g/dL — ABNORMAL LOW (ref 30.0–36.0)
MCV: 81 fL (ref 80.0–100.0)
Platelets: 197 10*3/uL (ref 150–400)
RBC: 3.79 MIL/uL — ABNORMAL LOW (ref 3.87–5.11)
RDW: 19.2 % — ABNORMAL HIGH (ref 11.5–15.5)
WBC: 4 10*3/uL (ref 4.0–10.5)
nRBC: 0 % (ref 0.0–0.2)

## 2019-06-23 LAB — RAPID HIV SCREEN (HIV 1/2 AB+AG)
HIV 1/2 Antibodies: NONREACTIVE
HIV-1 P24 Antigen - HIV24: NONREACTIVE

## 2019-06-23 LAB — TYPE AND SCREEN
ABO/RH(D): B POS
Antibody Screen: NEGATIVE

## 2019-06-23 LAB — ABO/RH: ABO/RH(D): B POS

## 2019-06-23 LAB — RPR: RPR Ser Ql: NONREACTIVE

## 2019-06-23 MED ORDER — LIDOCAINE HCL (PF) 1 % IJ SOLN: 30.0000 mL | INTRAMUSCULAR | Status: DC | PRN

## 2019-06-23 MED ORDER — WITCH HAZEL-GLYCERIN EX PADS: 1.0000 "application " | MEDICATED_PAD | CUTANEOUS | Status: DC | PRN

## 2019-06-23 MED ORDER — COCONUT OIL OIL
1.0000 "application " | TOPICAL_OIL | Status: DC | PRN
  Administered 2019-06-23: 1 via TOPICAL

## 2019-06-23 MED ORDER — TERBUTALINE SULFATE 1 MG/ML IJ SOLN: 0.2500 mg | Freq: Once | INTRAMUSCULAR | Status: DC | PRN

## 2019-06-23 MED ORDER — SENNOSIDES-DOCUSATE SODIUM 8.6-50 MG PO TABS
2.0000 | ORAL_TABLET | ORAL | Status: DC
  Administered 2019-06-23 – 2019-06-25 (×2): 2 via ORAL
  Filled 2019-06-23 (×2): qty 2

## 2019-06-23 MED ORDER — SOD CITRATE-CITRIC ACID 500-334 MG/5ML PO SOLN
30.0000 mL | ORAL | Status: DC | PRN
  Administered 2019-06-23: 30 mL via ORAL
  Filled 2019-06-23: qty 30

## 2019-06-23 MED ORDER — SIMETHICONE 80 MG PO CHEW: 80.0000 mg | CHEWABLE_TABLET | ORAL | Status: DC | PRN

## 2019-06-23 MED ORDER — LACTATED RINGERS IV SOLN: INTRAVENOUS | Status: DC

## 2019-06-23 MED ORDER — TETANUS-DIPHTH-ACELL PERTUSSIS 5-2.5-18.5 LF-MCG/0.5 IM SUSP
0.5000 mL | Freq: Once | INTRAMUSCULAR | Status: AC
  Administered 2019-06-25: 0.5 mL via INTRAMUSCULAR
  Filled 2019-06-23: qty 0.5

## 2019-06-23 MED ORDER — ONDANSETRON HCL 4 MG/2ML IJ SOLN: 4.0000 mg | INTRAMUSCULAR | Status: DC | PRN

## 2019-06-23 MED ORDER — BENZOCAINE-MENTHOL 20-0.5 % EX AERO
1.0000 "application " | INHALATION_SPRAY | CUTANEOUS | Status: DC | PRN
  Filled 2019-06-23 (×2): qty 56

## 2019-06-23 MED ORDER — ONDANSETRON HCL 4 MG PO TABS: 4.0000 mg | ORAL_TABLET | ORAL | Status: DC | PRN

## 2019-06-23 MED ORDER — ZOLPIDEM TARTRATE 5 MG PO TABS: 5.0000 mg | ORAL_TABLET | Freq: Every evening | ORAL | Status: DC | PRN

## 2019-06-23 MED ORDER — PRENATAL MULTIVITAMIN CH
1.0000 | ORAL_TABLET | Freq: Every day | ORAL | Status: DC
  Filled 2019-06-23 (×2): qty 1

## 2019-06-23 MED ORDER — DIPHENHYDRAMINE HCL 25 MG PO CAPS: 25.0000 mg | ORAL_CAPSULE | Freq: Four times a day (QID) | ORAL | Status: DC | PRN

## 2019-06-23 MED ORDER — OXYTOCIN BOLUS FROM INFUSION
500.0000 mL | Freq: Once | INTRAVENOUS | Status: AC
  Administered 2019-06-23: 500 mL via INTRAVENOUS

## 2019-06-23 MED ORDER — LACTATED RINGERS IV SOLN: 500.0000 mL | INTRAVENOUS | Status: DC | PRN

## 2019-06-23 MED ORDER — ACETAMINOPHEN 325 MG PO TABS
650.0000 mg | ORAL_TABLET | ORAL | Status: DC | PRN
  Administered 2019-06-23 – 2019-06-24 (×2): 650 mg via ORAL
  Filled 2019-06-23 (×2): qty 2

## 2019-06-23 MED ORDER — ONDANSETRON HCL 4 MG/2ML IJ SOLN: 4.0000 mg | Freq: Four times a day (QID) | INTRAMUSCULAR | Status: DC | PRN

## 2019-06-23 MED ORDER — OXYTOCIN 40 UNITS IN NORMAL SALINE INFUSION - SIMPLE MED
2.5000 [IU]/h | INTRAVENOUS | Status: DC
  Filled 2019-06-23: qty 1000

## 2019-06-23 MED ORDER — MISOPROSTOL 50MCG HALF TABLET
50.0000 ug | ORAL_TABLET | Freq: Once | ORAL | Status: AC
  Administered 2019-06-23: 50 ug via BUCCAL
  Filled 2019-06-23: qty 1

## 2019-06-23 MED ORDER — DIBUCAINE (PERIANAL) 1 % EX OINT: 1.0000 "application " | TOPICAL_OINTMENT | CUTANEOUS | Status: DC | PRN

## 2019-06-23 MED ORDER — IBUPROFEN 600 MG PO TABS
600.0000 mg | ORAL_TABLET | Freq: Four times a day (QID) | ORAL | Status: DC
  Administered 2019-06-23 – 2019-06-25 (×7): 600 mg via ORAL
  Filled 2019-06-23 (×7): qty 1

## 2019-06-23 NOTE — Progress Notes (Signed)
LABOR PROGRESS NOTE  Kathleen Morrison is a 34 y.o. G3P2002 at [redacted]w[redacted]d  admitted for PD IOL.  Subjective: Sometimes feeling contractions but not yet very strong  Objective: BP 124/79   Pulse 96   Temp 98.1 F (36.7 C) (Oral)   Resp 20   Ht 5\' 6"  (1.676 m)   Wt 83 kg   LMP 09/09/2018 (Approximate)   BMI 29.54 kg/m  or  Vitals:   06/23/19 1056 06/23/19 1103 06/23/19 1406 06/23/19 1524  BP:  122/84  124/79  Pulse:  90  96  Resp: 20 18 18 20   Temp: 98.1 F (36.7 C)     TempSrc: Oral     Weight:      Height:         Dilation: 4 Effacement (%): 70 Cervical Position: Posterior Station: -3 Presentation: Vertex Exam by:: dr FHT: baseline rate 140, moderate varibility, +acel, -decel Toco: irregular  Labs: Lab Results  Component Value Date   WBC 4.0 06/23/2019   HGB 8.9 (L) 06/23/2019   HCT 30.7 (L) 06/23/2019   MCV 81.0 06/23/2019   PLT 197 06/23/2019    Patient Active Problem List   Diagnosis Date Noted  . Post term pregnancy at [redacted] weeks gestation 06/23/2019  . Anemia during pregnancy in third trimester 04/04/2019  . AMA (advanced maternal age) multigravida 35+ 01/16/2019  . Nausea and vomiting during pregnancy prior to [redacted] weeks gestation 01/16/2019  . Ptyalism 01/16/2019  . Gastroesophageal reflux disease with esophagitis 03/15/2016  . Supervision of other normal pregnancy, antepartum 01/12/2016    Assessment / Plan: 34 y.o. G3P2002 at [redacted]w[redacted]d here for PD IOL.  Labor: no significant change with misoprostol or regular contractions. AROM for clear, moderate amount of fluid. Anticipate NSVD shortly (reports 1-2 hours after ROM in prior deliveries). Notified 31. RN to call for table and birthing tub.  Fetal Wellbeing:  Cat I Pain Control:  IV pain meds and nitrous PRN GBS: neg Anticipated MOD:  SVD   [redacted]w[redacted]d, MD/[facility] OB Fellow  06/23/2019, 3:25 PM

## 2019-06-23 NOTE — Progress Notes (Signed)
Labor Progress Note Kathleen Morrison is a 34 y.o. G3P2002 at [redacted]w[redacted]d presented for postdates IOL  S:  In tub, feels like contractions are spacing out  O:  BP 106/68   Pulse 88   Temp 98.3 F (36.8 C) (Oral)   Resp 18   Ht 5\' 6"  (1.676 m)   Wt 83 kg   LMP 09/09/2018 (Approximate)   BMI 29.54 kg/m   FHT: 140-150s via intermittent monitoring   CVE: deferred  A&P: 34 y.o. 31 [redacted]w[redacted]d postdates IOL #Labor: Encouraged patient to get out of tub and try nipple stimulation/hydrotherapy in shower to create contractions. May need further augmentation if labor stalls #Pain: waterbirth #FWB: Cat 1 #GBS negative  [redacted]w[redacted]d, CNM 5:59 PM

## 2019-06-23 NOTE — Anesthesia Preprocedure Evaluation (Deleted)
Anesthesia Evaluation    Reviewed: Allergy & Precautions, Patient's Chart, lab work & pertinent test results  Airway        Dental   Pulmonary neg pulmonary ROS,           Cardiovascular negative cardio ROS       Neuro/Psych negative neurological ROS     GI/Hepatic negative GI ROS, Neg liver ROS,   Endo/Other  negative endocrine ROS  Renal/GU negative Renal ROS     Musculoskeletal   Abdominal   Peds  Hematology  (+) anemia , Lab Results      Component                Value               Date                      WBC                      4.0                 06/23/2019                HGB                      8.9 (L)             06/23/2019                HCT                      30.7 (L)            06/23/2019                MCV                      81.0                06/23/2019                PLT                      197                 06/23/2019              Anesthesia Other Findings   Reproductive/Obstetrics (+) Pregnancy                             Anesthesia Physical Anesthesia Plan  ASA: III  Anesthesia Plan: Epidural   Post-op Pain Management:    Induction:   PONV Risk Score and Plan:   Airway Management Planned:   Additional Equipment:   Intra-op Plan:   Post-operative Plan:   Informed Consent:   Plan Discussed with:   Anesthesia Plan Comments: (41 Wk G3P2 w Anemia for LEA)        Anesthesia Quick Evaluation

## 2019-06-23 NOTE — H&P (Addendum)
OBSTETRIC ADMISSION HISTORY AND PHYSICAL  Kathleen Morrison is a 33 y.o. female G65P2002 with IUP at [redacted]w[redacted]d by LMP presenting for induction of labor.  She reports +FMs, No LOF, no VB.   She plans on breast feeding. She is planning for a vesectomy for birth control. She received her prenatal care at [facility]   Dating: By LMP --->  Estimated Date of Delivery: 06/16/19  Sono:    @[redacted]w[redacted]d , CWD, normal anatomy, cephalic presentation,  Anterior placenta, 2064g, 40% EFW   Prenatal History/Complications: Ptyalism GERD with esophagitis (protonix and tums) Anemia during Third Trimester daily iron  ASA 81mg  daily  Nausea and Emesis prior to [redacted] weeks GA, reglan   Past Medical History: Past Medical History:  Diagnosis Date  . Anemia   . Vaginal Pap smear, abnormal     Past Surgical History: History reviewed. No pertinent surgical history.  Obstetrical History: OB History    Gravida  3   Para  2   Term  2   Preterm      AB      Living  2     SAB      TAB      Ectopic      Multiple  0   Live Births  2        Obstetric Comments  Waterbirth (05-05-1990)        Social History: Social History   Socioeconomic History  . Marital status: Married    Spouse name: Not on file  . Number of children: Not on file  . Years of education: Not on file  . Highest education level: Not on file  Occupational History  . Occupation: temp service  Tobacco Use  . Smoking status: Never Smoker  . Smokeless tobacco: Never Used  Substance and Sexual Activity  . Alcohol use: Not Currently    Comment: occassionally  . Drug use: No  . Sexual activity: Not Currently    Partners: Male    Birth control/protection: None  Other Topics Concern  . Not on file  Social History Narrative  . Not on file   Social Determinants of Health   Financial Resource Strain:   . Difficulty of Paying Living Expenses:   Food Insecurity:   . Worried About in the Last Year:   . [place] in the Last Year:   Transportation Needs:   . Community education officer (Medical):   Barista Lack of Transportation (Non-Medical):   Physical Activity:   . Days of Exercise per Week:   . Minutes of Exercise per Session:   Stress:   . Feeling of Stress :   Social Connections:   . Frequency of Communication with Friends and Family:   . Frequency of Social Gatherings with Friends and Family:   . Attends Religious Services:   . Active Member of Clubs or Organizations:   . Attends Freight forwarder Meetings:   Marland Kitchen Marital Status:     Family History: Family History  Problem Relation Age of Onset  . Diabetes Mother   . Anxiety disorder Sister   . Cerebral palsy Brother   . Autism Brother     Allergies: Allergies  Allergen Reactions  . Pineapple Rash    Medications Prior to Admission  Medication Sig Dispense Refill Last Dose  . acetaminophen (TYLENOL) 325 MG tablet Take 2 tablets (650 mg total) by mouth every 4 (four) hours as needed (for  pain scale < 4). 30 tablet 0   . aspirin 81 MG chewable tablet Chew by mouth daily.     . calcium carbonate (TUMS) 500 MG chewable tablet Chew 1 tablet by mouth daily.     . Ensure (ENSURE) Take 237 mLs by mouth 2 (two) times daily between meals. 237 mL 12   . ferrous sulfate (FERROUSUL) 325 (65 FE) MG tablet Take 1 tablet (325 mg total) by mouth 2 (two) times daily with a meal. 60 tablet 2   . loratadine (CLARITIN) 10 MG tablet Take 10 mg by mouth daily.     . metoCLOPramide (REGLAN) 10 MG tablet Take 1 tablet (10 mg total) by mouth 3 (three) times daily with meals as needed for nausea. 90 tablet 2   . pantoprazole (PROTONIX) 40 MG tablet Take 1 tablet (40 mg total) by mouth daily. 30 tablet 5      Review of Systems   All systems reviewed and negative except as stated in HPI  Blood pressure 122/84, pulse 90, temperature 98.1 F (36.7 C), temperature source Oral, resp. rate 18, height 5\' 6"  (1.676  m), weight 83 kg, last menstrual period 09/09/2018, currently breastfeeding.   General appearance: alert, cooperative, appears stated age and no distress Lungs: normal effort Heart: regular rate  Abdomen: soft, non-tender; bowel sounds normal Pelvic: gravid uterus GU: No vaginal lesions  Extremities: no LE edema  Presentation: cephalic Fetal monitoringBaseline: 143 bpm, Variability: Good {> 6 bpm), Accelerations: Reactive and Decelerations: Absent Uterine activity: present Dilation: 3 Effacement (%): 70 Station: [place], -3 Exam by:: dr [email or web address]  Prenatal labs: ABO, Rh: --/--/B POS (05/22 06-07-1997) Antibody: NEG (05/22 0948) Rubella: 2.09 (10/20 1052) RPR: NON REACTIVE (05/22 0948)  HBsAg: NON-REACTIVE (10/20 1052)  HIV: NON REACTIVE (05/22 0948)  GBS: Negative/-- (04/23 0000)Neg  2 hr Glucola: normal 08-19-1980) Genetic screening : Materni21 negative Anatomy ([phone] normal   Prenatal Transfer Tool  Maternal Diabetes: No Genetic Screening: Normal Maternal Ultrasounds/Referrals: Normal Fetal Ultrasounds or other Referrals:  None Maternal Substance Abuse:  No Significant Maternal Medications:  Meds include: Protonix Significant Maternal Lab Results: Group B Strep negative  Results for orders placed or performed during the hospital encounter of 06/23/19 (from the past 24 hour(s))  CBC   Collection Time: 06/23/19  9:48 AM  Result Value Ref Range   WBC 4.0 4.0 - 10.5 K/uL   RBC 3.79 (L) 3.87 - 5.11 MIL/uL   Hemoglobin 8.9 (L) 12.0 - 15.0 g/dL   HCT 06/25/19 (L) 95.6 - 21.3 %   MCV 81.0 80.0 - 100.0 fL   MCH 23.5 (L) 26.0 - 34.0 pg   MCHC 29.0 (L) 30.0 - 36.0 g/dL   RDW 08.6 (H) 57.8 - 46.9 %   Platelets 197 150 - 400 K/uL   nRBC 0.0 0.0 - 0.2 %  RPR   Collection Time: 06/23/19  9:48 AM  Result Value Ref Range   RPR Ser Ql NON REACTIVE NON REACTIVE  Rapid HIV screen (HIV 1/2 Ab+Ag)   Collection Time: 06/23/19  9:48 AM  Result Value Ref Range   HIV-1 P24 Antigen - HIV24 NON  REACTIVE NON REACTIVE   HIV 1/2 Antibodies NON REACTIVE NON REACTIVE   Interpretation (HIV Ag Ab)      A non reactive test result means that HIV 1 or HIV 2 antibodies and HIV 1 p24 antigen were not detected in the specimen.  Type and screen   Collection Time: 06/23/19  9:48 AM  Result  Value Ref Range   ABO/RH(D) B POS    Antibody Screen NEG    Sample Expiration      06/26/2019,2359 Performed at [facility], [place] [address], [place], [place] [address]     Patient Active Problem List   Diagnosis Date Noted  . Post term pregnancy at [redacted] weeks gestation 06/23/2019  . Anemia during pregnancy in third trimester 04/04/2019  . AMA (advanced maternal age) multigravida 35+ 01/16/2019  . Nausea and vomiting during pregnancy prior to [redacted] weeks gestation 01/16/2019  . Ptyalism 01/16/2019  . Gastroesophageal reflux disease with esophagitis 03/15/2016  . Supervision of other normal pregnancy, antepartum 01/12/2016    Assessment/Plan:  Kathleen Morrison is a 34 y.o. G3P2002 at [redacted]w[redacted]d here for induction of labor.   #Labor:  Latent stage labor, no foley bulb needed, cytotec, anticipate vaginal delivery/waterbirth  #Pain: None at this time  #FWB: Category I FHT strip #ID:  GBS Neg, HIV Neg on admission testing (Partner positive with reportedly undetetable VL) will not place on AZT, COVID neg as of 06/21/19 #MOF: breastfeeding #MOC: vasectomy, discussed latency period of this take effect and importance of having backup method in meantime #Circ:  N/A, female  #Anemia: hgb 8.9 on admission, will monitor with postpartum CBC, plan to transfuse as necessary, will continue ambulatory PO iron   TDaP: completed 04/03/19 Declined flu vaccine  Stark Klein, MD Family Medicine PGY-1 06/23/2019, 11:35 AM    OB FELLOW ATTESTATION  I have seen and examined this patient and edited the above documentation in the resident's note to reflect any changes or updates.  Augustin Coupe, MD/MPH OB Fellow   06/23/2019, 12:02 PM

## 2019-06-23 NOTE — Progress Notes (Signed)
0945 Iv attempt x 2 without success-mEarly,RN. CGoodnight,RN at [facility] attempting IV. Pt updated on POC. Labs/IV being drawn and Dr Crissie Reese will return to review labs/check pt and start induction process. MAU midwife will attend labor and birth.

## 2019-06-23 NOTE — Progress Notes (Signed)
Pt arrived at 0841. To bathroom to change. At [phone], pt ready to get into bed. Pt states she plans waterbirth. Agrees to saline lock and blood draw. Will notify Faculty practice.

## 2019-06-23 NOTE — Discharge Summary (Addendum)
Postpartum Discharge Summary    Patient Name: Kathleen Morrison DOB: 1985/12/30 MRN: [id number]  Date of admission: 06/23/2019 Delivery date:06/23/2019  Delivering provider: Wende Mott  Date of discharge: 06/25/2019  Admitting diagnosis: Post term pregnancy at [redacted] weeks gestation [O48.0, Z3A.41] Indication for care or intervention in labor or delivery [O75.9] Intrauterine pregnancy: [redacted]w[redacted]d    Secondary diagnosis:  Active Problems:   Supervision of other normal pregnancy, antepartum   AMA (advanced maternal age) multigravida 35+   Post term pregnancy at 465weeks gestation   Indication for care or intervention in labor or delivery  Additional problems: partner HIV+ with undetectable viral load     Discharge diagnosis: Term Pregnancy Delivered                                              Post partum procedures: PO iron supplementation  Augmentation: AROM and Cytotec Complications: None  Hospital course: Induction of Labor With Vaginal Delivery   34y.o. yo G3P2002 at 457w0das admitted to the hospital 06/23/2019 for induction of labor.  Indication for induction: Postdates.  Patient had an uncomplicated labor course as follows: Membrane Rupture Time/Date: 3:20 PM ,06/23/2019   Delivery Method:Vaginal, Spontaneous  Episiotomy: None  Lacerations:   first degree, not repaired as hemostasis was achieved  Details of delivery can be found in separate delivery note.  Patient had a routine postpartum course. Patient is discharged home 06/25/19.  Newborn Data: Birth date:06/23/2019  Birth time:8:09 PM  Gender:Female  Living status:Living  Apgars:8 ,9  Weight:4000 g   Magnesium Sulfate received: No BMZ received: No Rhophylac:N/A MMR:Yes T-DaP:Given prenatally Flu: N/A Transfusion:No  Physical exam  Vitals:   06/24/19 0758 06/24/19 1220 06/24/19 2148 06/25/19 0530  BP: 107/70 106/76 115/69 99/68  Pulse: 85 (!) 105 95 79  Resp: 17 20 18 14   Temp: 98 F (36.7 C) 98.3 F  (36.8 C) 98.3 F (36.8 C) 98 F (36.7 C)  TempSrc: Oral Oral Oral Oral  SpO2: 99%   100%  Weight:      Height:       General: alert, cooperative and no distress Lochia: appropriate Uterine Fundus: firm Incision: N/A DVT Evaluation: No evidence of DVT seen on physical exam. Negative Homan's sign. No cords or calf tenderness. No significant calf/ankle edema. Labs: Lab Results  Component Value Date   WBC 8.4 06/24/2019   HGB 7.3 (L) 06/24/2019   HCT 24.7 (L) 06/24/2019   MCV 80.5 06/24/2019   PLT 181 06/24/2019   CMP Latest Ref Rng & Units 05/05/2016  Glucose 65 - 99 mg/dL 98  BUN 6 - 20 mg/dL 13  Creatinine 0.44 - 1.00 mg/dL 0.49  Sodium 135 - 145 mmol/L 137  Potassium 3.5 - 5.1 mmol/L 4.2  Chloride 101 - 111 mmol/L 106  CO2 22 - 32 mmol/L 21(L)  Calcium 8.9 - 10.3 mg/dL 8.9  Total Protein 6.5 - 8.1 g/dL 6.7  Total Bilirubin 0.3 - 1.2 mg/dL 0.3  Alkaline Phos 38 - 126 U/L 74  AST 15 - 41 U/L 20  ALT 14 - 54 U/L 13(L)   Edinburgh Score: Edinburgh Postnatal Depression Scale Screening Tool 06/24/2019  I have been able to laugh and see the funny side of things. 0  I have looked forward with enjoyment to things. 0  I have blamed myself unnecessarily  when things went wrong. 1  I have been anxious or worried for no good reason. 1  I have felt scared or panicky for no good reason. 1  Things have been getting on top of me. 0  I have been so unhappy that I have had difficulty sleeping. 0  I have felt sad or miserable. 0  I have been so unhappy that I have been crying. 1  The thought of harming myself has occurred to me. 0  Edinburgh Postnatal Depression Scale Total 4     After visit meds:  Allergies as of 06/25/2019       Reactions   Pineapple Rash        Medication List     STOP taking these medications    aspirin 81 MG chewable tablet   Ensure   metoCLOPramide 10 MG tablet Commonly known as: Reglan       TAKE these medications    acetaminophen 325  MG tablet Commonly known as: Tylenol Take 2 tablets (650 mg total) by mouth every 4 (four) hours as needed (for pain scale < 4).   ferrous sulfate 325 (65 FE) MG tablet Commonly known as: FerrouSul Take 1 tablet (325 mg total) by mouth every other day. Please take with plenty of fluids every other day. What changed:  when to take this additional instructions   loratadine 10 MG tablet Commonly known as: CLARITIN Take 10 mg by mouth daily.   pantoprazole 40 MG tablet Commonly known as: Protonix Take 1 tablet (40 mg total) by mouth daily.   polyethylene glycol powder 17 GM/SCOOP powder Commonly known as: MiraLax Take 255 g by mouth once for 1 dose.   senna 8.6 MG Tabs tablet Commonly known as: SENOKOT Take 1 tablet (8.6 mg total) by mouth every other day.   Tums 500 MG chewable tablet Generic drug: calcium carbonate Chew 1 tablet by mouth daily.         Discharge home in stable condition Infant Feeding: Breast Infant Disposition:home with mother Discharge instruction: per After Visit Summary and Postpartum booklet. Activity: Advance as tolerated. Pelvic rest for 6 weeks.  Diet: routine diet  Future Appointments  Date Time Provider [place]  07/24/2019  8:35 AM Rasch, Artist Pais, NP [facility]   Follow up Visit:  Please schedule this patient for Postpartum visit in: 4 weeks with the following provider: Any provider Virtual For C/S patients schedule nurse incision check in weeks 2 weeks: no Low risk pregnancy complicated by: n/a Delivery mode:  waterbirth! Anticipated Birth Control:  vasectomy PP Procedures needed: n/a  Schedule Integrated [place] visit: no   06/25/2019 Stark Klein, MD  GME ATTESTATION:  I saw and evaluated the patient. I agree with the findings and the plan of care as documented in the resident's note.  Merilyn Baba, DO OB Fellow, [place] [facility] [place] 06/26/2019 7:50 AM

## 2019-06-24 LAB — CBC
HCT: 24.7 % — ABNORMAL LOW (ref 36.0–46.0)
Hemoglobin: 7.3 g/dL — ABNORMAL LOW (ref 12.0–15.0)
MCH: 23.8 pg — ABNORMAL LOW (ref 26.0–34.0)
MCHC: 29.6 g/dL — ABNORMAL LOW (ref 30.0–36.0)
MCV: 80.5 fL (ref 80.0–100.0)
Platelets: 181 10*3/uL (ref 150–400)
RBC: 3.07 MIL/uL — ABNORMAL LOW (ref 3.87–5.11)
RDW: 19.2 % — ABNORMAL HIGH (ref 11.5–15.5)
WBC: 8.4 10*3/uL (ref 4.0–10.5)
nRBC: 0.2 % (ref 0.0–0.2)

## 2019-06-24 MED ORDER — FERROUS SULFATE 325 (65 FE) MG PO TABS
325.0000 mg | ORAL_TABLET | ORAL | Status: DC
  Filled 2019-06-24: qty 1

## 2019-06-24 NOTE — Progress Notes (Addendum)
Post Partum Day 1  Subjective:  Kathleen Morrison is a 34 y.o. G3P3003 [redacted]w[redacted]d s/p WB delivery.  No acute events overnight.  Pt denies problems with ambulating, voiding or po intake.  She denies nausea or vomiting.  Pain is well controlled, but pt. endorses cramp-like sensations in legs and abdomen intermittently.  She has had flatus. She has not had bowel movement. Lochia is normal/moderate.  Plan for birth control is vasectomy.  Method of Feeding: Breastfeeding.  Objective: BP 102/70 (BP Location: Left Arm)   Pulse 89   Temp 98.1 F (36.7 C) (Oral)   Resp 16   Ht 5\' 6"  (1.676 m)   Wt 83 kg   LMP 09/09/2018 (Approximate)   SpO2 99%   Breastfeeding Unknown   BMI 29.54 kg/m   Physical Exam:  General: alert, cooperative and no distress Lochia:normal flow Chest: CTAB, no increased work of breathing Heart: RRR no m/r/g Abdomen: +BS, soft, nontender Uterine Fundus: firm DVT Evaluation: No evidence of DVT seen on physical exam. Extremities: no pitting edema  Recent Labs    06/23/19 0948  HGB 8.9*  HCT 30.7*    Assessment/Plan:  ASSESSMENT: Kathleen Morrison is a 34 y.o. G3P3003 [redacted]w[redacted]d ppd #1 s/p WB delivery doing well.   Anemia: Continue monitoring, PO iron supplement. Breastfeeding: provide lactation support as needed Plan for potential discharge tomorrow.   LOS: 1 day   [redacted]w[redacted]d 06/24/2019, 6:37 AM   GME ATTESTATION:  I saw and evaluated the patient. I agree with the findings and the plan of care as documented in the student's note.  06/26/2019, DO OB Fellow, Faculty [facility], [facility] 06/24/2019 6:44 AM

## 2019-06-24 NOTE — Lactation Note (Signed)
This note was copied from a baby's chart. Lactation Consultation Note  Patient Name: Kathleen Morrison [id number] Date: 06/24/2019 Reason for consult: Initial assessment;Term;Other (Comment)(AMA)  Visited with mom of a 16 hours old FT female, she's a P3 and experienced BF. She BF her other kids for at least 1-1.5 years and didn't report any BF difficulties. She participated in the [facility] program at the [facility] and she's already familiar with hand expression and able to get colostrum when doing so.  Offered assistance with latch but mom politely declined, she said baby already fed. Asked mom to call for assistance when needed. Reviewed normal newborn behavior, feeding cues, cluster feeding and size of baby's stomach. FOB is HIV (+) but mom has tested (-) during this pregnancy.  Feeding plan  1. Encouraged mom to feed baby STS 8-12 times/24 hours or sooner if feeding cues are present 2. Hand expression and spoon feeding were also encouraged  BF brochure, BF resources and feeding diary were reviewed. No support person in mom's room at the time of [facility] consultation. Mom reported all questions and concerns were answered, she's aware of LC OP services and will call PRN.   Maternal Data Formula Feeding for Exclusion: No Has patient been taught Hand Expression?: Yes Does the patient have breastfeeding experience prior to this delivery?: Yes  Feeding Feeding Type: Breast Fed  LATCH Score Latch: Grasps breast easily, tongue down, lips flanged, rhythmical sucking.  Audible Swallowing: A few with stimulation  Type of Nipple: Everted at rest and after stimulation  Comfort (Breast/Nipple): Soft / non-tender  Hold (Positioning): Assistance needed to correctly position infant at breast and maintain latch.  LATCH Score: 8  Interventions Interventions: Breast feeding basics reviewed  Lactation Tools Discussed/Used WIC Program: Yes   Consult Status Consult Status: Follow-up Date:  06/25/19 Follow-up type: In-patient    Jakory Matsuo Venetia Constable 06/24/2019, 5:11 PM

## 2019-06-24 NOTE — Progress Notes (Signed)
Patient began to complain of headache and pressure in her face around 0000. She described pain as a sinus pressure felt under her eyes, around her nose, and into her forehead. Vitals stable, no spots/blurry vision, or epigastric pain. Patient medicated at 2243 with tylenol and ibuprofen with little relief. MD on call made aware. No new orders received. Patient showered and reports that he pain has decreased since taking a shower. Will continue to monitor patient.   Peter Minium 1:14 AM 06/24/2019

## 2019-06-25 MED ORDER — FERROUS SULFATE 325 (65 FE) MG PO TABS: 325.0000 mg | ORAL_TABLET | ORAL | 2 refills | Status: DC

## 2019-06-25 MED ORDER — POLYETHYLENE GLYCOL 3350 17 GM/SCOOP PO POWD: 1.0000 | Freq: Once | ORAL | 0 refills | Status: AC

## 2019-06-25 MED ORDER — SENNA 8.6 MG PO TABS: 1.0000 | ORAL_TABLET | ORAL | 0 refills | Status: DC

## 2019-06-25 NOTE — Progress Notes (Addendum)
Post Partum Day 2  Subjective: Kathleen Morrison is a 34 y.o. E0P2330 [redacted]w[redacted]d s/p VD delivery.  No acute events overnight.  Pt denies problems with ambulating, voiding or po intake.  She denies nausea or vomiting.  Pain is well controlled.  She has had flatus and a small BM. Lochia is normal/moderate.  Plan for birth control is vasectomy.  Method of Feeding: Breastfeeding. up ad lib, voiding, tolerating PO and + flatus  Objective: Blood pressure 99/68, pulse 79, temperature 98 F (36.7 C), temperature source Oral, resp. rate 14, height 5\' 6"  (1.676 m), weight 83 kg, last menstrual period 09/09/2018, SpO2 100 %, unknown if currently breastfeeding.  Physical Exam:  General: alert, cooperative and no distress Lochia: appropriate Chest: CTAB, no increased WOB Heart: RRR, no m/r/g Abdomen: +BS, soft, nontender  Uterine Fundus: firm at level  DVT Evaluation: No evidence of DVT seen on physical exam. Extremities: no pitting edema  Recent Labs    06/23/19 0948 06/24/19 0548  HGB 8.9* 7.3*  HCT 30.7* 24.7*    Assessment/Plan:   Kathleen Morrison is a 34 y.o. G3P3003 [redacted]w[redacted]d ppd #1 s/p VD via waterbirth delivery doing well.     Anemia:PO iron supplement, once every other day.  Discharge home and Breastfeeding   [redacted]w[redacted]d, MS3 06/25/19  RESIDENT ATTESTATION OF STUDENT NOTE   I have seen and examined this patient.    I have discussed the findings and exam with the medical student and agree with the above note, which I have edited appropriately. I helped develop the management plan that is described in the student's note, and I agree with the content.   06/27/19, MD 06/25/2019, 8:20 AM    LOS: 2 days    GME ATTESTATION:  I saw and evaluated the patient. I agree with the findings and the plan of care as documented in the student's/resident's note.  06/27/2019, DO OB Fellow, Faculty [facility], [facility] for [facility] 06/25/2019 8:27 AM

## 2019-06-25 NOTE — Discharge Instructions (Signed)
Please take iron pills every other day with plenty of fluids, miralax and Senna stool softner to help with constipation.   Iron Deficiency Anemia, Adult Iron-deficiency anemia is when you have a low amount of red blood cells or hemoglobin. This happens because you have too little iron in your body. Hemoglobin carries oxygen to parts of the body. Anemia can cause your body to not get enough oxygen. It may or may not cause symptoms. Follow these instructions at home: Medicines  Take over-the-counter and prescription medicines only as told by your doctor. This includes iron pills (supplements) and vitamins.  If you cannot handle taking iron pills by mouth, ask your doctor about getting iron through: ? A vein (intravenously). ? A shot (injection) into a muscle.  Take iron pills when your stomach is empty. If you cannot handle this, take them with food.  Do not drink milk or take antacids at the same time as your iron pills.  To prevent trouble pooping (constipation), eat fiber or take medicine (stool softener) as told by your doctor. Eating and drinking   Talk with your doctor before changing the foods you eat. He or she may tell you to eat foods that have a lot of iron, such as: ? Liver. ? Lowfat (lean) beef. ? Breads and cereals that have iron added to them (fortified breads and cereals). ? Eggs. ? Dried fruit. ? Dark green, leafy vegetables.  Drink enough fluid to keep your pee (urine) clear or pale yellow.  Eat fresh fruits and vegetables that are high in vitamin C. They help your body to use iron. Foods with a lot of vitamin C include: ? Oranges. ? Peppers. ? Tomatoes. ? Mangoes. General instructions  Return to your normal activities as told by your doctor. Ask your doctor what activities are safe for you.  Keep yourself clean, and keep things clean around you (your surroundings). Anemia can make you get sick more easily.  Keep all follow-up visits as told by your  doctor. This is important. Contact a doctor if:  You feel sick to your stomach (nauseous).  You throw up (vomit).  You feel weak.  You are sweating for no clear reason.  You have trouble pooping, such as: ? Pooping (having a bowel movement) less than 3 times a week. ? Straining to poop. ? Having poop that is hard, dry, or larger than normal. ? Feeling full or bloated. ? Pain in the lower belly. ? Not feeling better after pooping. Get help right away if:  You pass out (faint). If this happens, do not drive yourself to the hospital. Call your local emergency services (911 in the [place]  You have chest pain.  You have shortness of breath that: ? Is very bad. ? Gets worse with physical activity.  You have a fast heartbeat.  You get light-headed when getting up from sitting or lying down. This information is not intended to replace advice given to you by your health care provider. Make sure you discuss any questions you have with your health care provider. Document Revised: 12/31/2016 Document Reviewed: 10/08/2015 Elsevier Patient Education  2020 [employer].     Vaginal Delivery  Vaginal delivery means that you give birth by pushing your baby out of your birth canal (vagina). A team of health care providers will help you before, during, and after vaginal delivery. Birth experiences are unique for every woman and every pregnancy, and birth experiences vary depending on where you choose to give  birth. What happens when I arrive at the birth center or hospital? Once you are in labor and have been admitted into the hospital or birth center, your health care provider may:  Review your pregnancy history and any concerns that you have.  Insert an IV into one of your veins. This may be used to give you fluids and medicines.  Check your blood pressure, pulse, temperature, and heart rate (vital signs).  Check whether your bag of water (amniotic sac) has broken  (ruptured).  Talk with you about your birth plan and discuss pain control options. Monitoring Your health care provider may monitor your contractions (uterine monitoring) and your baby's heart rate (fetal monitoring). You may need to be monitored:  Often, but not continuously (intermittently).  All the time or for long periods at a time (continuously). Continuous monitoring may be needed if: ? You are taking certain medicines, such as medicine to relieve pain or make your contractions stronger. ? You have pregnancy or labor complications. Monitoring may be done by:  Placing a special stethoscope or a handheld monitoring device on your abdomen to check your baby's heartbeat and to check for contractions.  Placing monitors on your abdomen (external monitors) to record your baby's heartbeat and the frequency and length of contractions.  Placing monitors inside your uterus through your vagina (internal monitors) to record your baby's heartbeat and the frequency, length, and strength of your contractions. Depending on the type of monitor, it may remain in your uterus or on your baby's head until birth.  Telemetry. This is a type of continuous monitoring that can be done with external or internal monitors. Instead of having to stay in bed, you are able to move around during telemetry. Physical exam Your health care provider may perform frequent physical exams. This may include:  Checking how and where your baby is positioned in your uterus.  Checking your cervix to determine: ? Whether it is thinning out (effacing). ? Whether it is opening up (dilating). What happens during labor and delivery?  Normal labor and delivery is divided into the following three stages: Stage 1  This is the longest stage of labor.  This stage can last for hours or days.  Throughout this stage, you will feel contractions. Contractions generally feel mild, infrequent, and irregular at first. They get stronger,  more frequent (about every 2-3 minutes), and more regular as you move through this stage.  This stage ends when your cervix is completely dilated to 4 inches (10 cm) and completely effaced. Stage 2  This stage starts once your cervix is completely effaced and dilated and lasts until the delivery of your baby.  This stage may last from 20 minutes to 2 hours.  This is the stage where you will feel an urge to push your baby out of your vagina.  You may feel stretching and burning pain, especially when the widest part of your baby's head passes through the vaginal opening (crowning).  Once your baby is delivered, the umbilical cord will be clamped and cut. This usually occurs after waiting a period of 1-2 minutes after delivery.  Your baby will be placed on your bare chest (skin-to-skin contact) in an upright position and covered with a warm blanket. Watch your baby for feeding cues, like rooting or sucking, and help the baby to your breast for his or her first feeding. Stage 3  This stage starts immediately after the birth of your baby and ends after you deliver the placenta.  This stage may take anywhere from 5 to 30 minutes.  After your baby has been delivered, you will feel contractions as your body expels the placenta and your uterus contracts to control bleeding. What can I expect after labor and delivery?  After labor is over, you and your baby will be monitored closely until you are ready to go home to ensure that you are both healthy. Your health care team will teach you how to care for yourself and your baby.  You and your baby will stay in the same room (rooming in) during your hospital stay. This will encourage early bonding and successful breastfeeding.  You may continue to receive fluids and medicines through an IV.  Your uterus will be checked and massaged regularly (fundal massage).  You will have some soreness and pain in your abdomen, vagina, and the area of skin  between your vaginal opening and your anus (perineum).  If an incision was made near your vagina (episiotomy) or if you had some vaginal tearing during delivery, cold compresses may be placed on your episiotomy or your tear. This helps to reduce pain and swelling.  You may be given a squirt bottle to use instead of wiping when you go to the bathroom. To use the squirt bottle, follow these steps: ? Before you urinate, fill the squirt bottle with warm water. Do not use hot water. ? After you urinate, while you are sitting on the toilet, use the squirt bottle to rinse the area around your urethra and vaginal opening. This rinses away any urine and blood. ? Fill the squirt bottle with clean water every time you use the bathroom.  It is normal to have vaginal bleeding after delivery. Wear a sanitary pad for vaginal bleeding and discharge. Summary  Vaginal delivery means that you will give birth by pushing your baby out of your birth canal (vagina).  Your health care provider may monitor your contractions (uterine monitoring) and your baby's heart rate (fetal monitoring).  Your health care provider may perform a physical exam.  Normal labor and delivery is divided into three stages.  After labor is over, you and your baby will be monitored closely until you are ready to go home.  This information is not intended to replace advice given to you by your health care provider. Make sure you discuss any questions you have with your health care provider. Document Revised: 02/22/2017 Document Reviewed: 02/22/2017 Elsevier Patient Education  2020 [employer].

## 2019-06-25 NOTE — Lactation Note (Signed)
This note was copied from a baby's chart. Lactation Consultation Note  Patient Name: Kathleen Morrison IPJAS'N Date: 06/25/2019 Reason for consult: Follow-up assessment;Term;Infant weight loss;Other (Comment)(6 % weight loss , 33 hours Bili 4.6 , LC reviewed and updated the doc flow sheets per mom) Baby is 95 hours old  LC reviewed the doc flow sheets with mom and updated.  Baby has eaten in the last hour and is presently asleep.  Per mom baby has been D/C by the doctor.  Per mom hearing swallows with feedings. Mom denies soreness.  Sore nipple and engorgement prevention and tx reviewed.  Per mom has a DEBP , and hand pump at home.  Mom aware of the [facility] resources after D/C and is active to [facility].    Maternal Data    Feeding Feeding Type: (per mom baby last fed at 945 am for 10 mins)  LATCH Score                   Interventions Interventions: Breast feeding basics reviewed  Lactation Tools Discussed/Used WIC Program: Yes Pump Review: Milk Storage   Consult Status Consult Status: Complete Date: 06/25/19    [name] 06/25/2019, 10:27 AM

## 2019-07-24 ENCOUNTER — Other Ambulatory Visit: Payer: Self-pay

## 2019-07-24 ENCOUNTER — Telehealth (INDEPENDENT_AMBULATORY_CARE_PROVIDER_SITE_OTHER): Payer: Medicaid Other | Admitting: Obstetrics and Gynecology

## 2019-07-24 DIAGNOSIS — Z1389 Encounter for screening for other disorder: Secondary | ICD-10-CM

## 2019-07-24 DIAGNOSIS — Z3480 Encounter for supervision of other normal pregnancy, unspecified trimester: Secondary | ICD-10-CM

## 2019-07-24 NOTE — Progress Notes (Signed)
TELEHEALTH POSTPARTUM VISIT ENCOUNTER NOTE  I connected with@ on 07/24/19 at  8:35 AM EDT by telephone at home and verified that I am speaking with the correct person using two identifiers.   I discussed the limitations, risks, security and privacy concerns of performing an evaluation and management service by telephone and the availability of in person appointments. I also discussed with the patient that there may be a patient responsible charge related to this service. The patient expressed understanding and agreed to proceed.  Appointment Date: 07/24/2019  [facility]   Chief Complaint:  Postpartum Visit  History of Present Illness: Kathleen Morrison is a 34 y.o. African-American [phone] (Patient's last menstrual period was 09/09/2018 (approximate).), seen for the above chief complaint. Her past medical history is significant for GERD, AMA, Anemia.   She is s/p normal spontaneous vaginal delivery on 5/22 at 41 weeks; she was discharged to home on 06/25/2019. Pregnancy complicated by AMA.  Baby is doing well.  Complains of: None  Vaginal bleeding or discharge: Yes - staining  Mode of feeding infant: Breast Intercourse: No  Contraception: IUD- considering this. Husband is getting vasectomy.  PP depression s/s: No .  Any bowel or bladder issues: No  Pap smear: no abnormalities (date: 2017)  Review of Systems: Positive for see HPI. Her 12 point review of systems is negative or as noted in the History of Present Illness.  Patient Active Problem List   Diagnosis Date Noted  . Post term pregnancy at [redacted] weeks gestation 06/23/2019  . Indication for care or intervention in labor or delivery 06/23/2019  . Anemia during pregnancy in third trimester 04/04/2019  . AMA (advanced maternal age) multigravida 35+ 01/16/2019  . Nausea and vomiting during pregnancy prior to [redacted] weeks gestation 01/16/2019  . Ptyalism 01/16/2019  . Gastroesophageal reflux disease with esophagitis  03/15/2016  . Supervision of other normal pregnancy, antepartum 01/12/2016    Medications Clint Craker had no medications administered during this visit. Current Outpatient Medications  Medication Sig Dispense Refill  . acetaminophen (TYLENOL) 325 MG tablet Take 2 tablets (650 mg total) by mouth every 4 (four) hours as needed (for pain scale < 4). 30 tablet 0  . ferrous sulfate (FERROUSUL) 325 (65 FE) MG tablet Take 1 tablet (325 mg total) by mouth every other day. Please take with plenty of fluids every other day. 60 tablet 2  . loratadine (CLARITIN) 10 MG tablet Take 10 mg by mouth daily.    Marland Kitchen senna (SENOKOT) 8.6 MG TABS tablet Take 1 tablet (8.6 mg total) by mouth every other day. 120 tablet 0   No current facility-administered medications for this visit.    Allergies Pineapple  Physical Exam:  General:  Alert, oriented and cooperative.   Mental Status: Normal mood and affect perceived. Normal judgment and thought content.  Rest of physical exam deferred due to type of encounter  PP Depression Screening:    Edinburgh Postnatal Depression Scale - 07/24/19 0821      Edinburgh Postnatal Depression Scale:  In the Past 7 Days   I have been able to laugh and see the funny side of things. 0    I have looked forward with enjoyment to things. 0    I have blamed myself unnecessarily when things went wrong. 1    I have been anxious or worried for no good reason. 0    I have felt scared or panicky for no good reason. 0    Things have  been getting on top of me. 0    I have been so unhappy that I have had difficulty sleeping. 0    I have felt sad or miserable. 0    I have been so unhappy that I have been crying. 0    The thought of harming myself has occurred to me. 0    Edinburgh Postnatal Depression Scale Total 1           Assessment:Patient is a 34 y.o. [id number] who is 4 weeks postpartum from a normal spontaneous vaginal delivery.  She is doing well.   Plan:  Return to the  office for IUD placement.  Patient doing well. No concerns. Has not checked her BP.  Needs pap in the next year.    RTC as needed.   I discussed the assessment and treatment plan with the patient. The patient was provided an opportunity to ask questions and all were answered. The patient agreed with the plan and demonstrated an understanding of the instructions.   The patient was advised to call back or seek an in-person evaluation/go to the ED for any concerning postpartum symptoms.  I provided 10 minutes of non-face-to-face time during this encounter.   Venia Carbon, NP [facility] [employer], [facility]

## 2019-08-10 ENCOUNTER — Encounter: Payer: Self-pay | Admitting: Certified Nurse Midwife

## 2019-08-10 ENCOUNTER — Other Ambulatory Visit: Payer: Self-pay

## 2019-08-10 ENCOUNTER — Ambulatory Visit (INDEPENDENT_AMBULATORY_CARE_PROVIDER_SITE_OTHER): Payer: Medicaid Other | Admitting: Certified Nurse Midwife

## 2019-08-10 VITALS — BP 114/79 | HR 82 | Ht 66.0 in | Wt 162.0 lb

## 2019-08-10 DIAGNOSIS — Z3201 Encounter for pregnancy test, result positive: Secondary | ICD-10-CM

## 2019-08-10 DIAGNOSIS — Z30430 Encounter for insertion of intrauterine contraceptive device: Secondary | ICD-10-CM

## 2019-08-10 LAB — POCT URINE PREGNANCY: Preg Test, Ur: POSITIVE — AB

## 2019-08-10 LAB — HCG, QUANTITATIVE, PREGNANCY: HCG, Total, QN: <3 m[IU]/mL

## 2019-08-10 MED ORDER — LEVONORGESTREL 19.5 MCG/DAY IU IUD: INTRAUTERINE_SYSTEM | Freq: Once | INTRAUTERINE | Status: DC

## 2019-08-10 NOTE — Progress Notes (Signed)
   GYNECOLOGY OFFICE VISIT NOTE  History:  34 y.o. [id number] here today for IUD insertion. She is 7 weeks post SVD. She denies IC since delivery. She stopped bleeding last week. Husband is planning vasectomy but is not scheduled yet.  Past Medical History:  Diagnosis Date  . Anemia   . Vaginal Pap smear, abnormal     History reviewed. No pertinent surgical history.  The following portions of the patient's history were reviewed and updated as appropriate: allergies, current medications, past family history, past medical history, past social history, past surgical history and problem list.    Review of Systems:  Negative except noted in HPI Genito-Urinary ROS: negative for - discharge and pain   Objective:  Physical Exam BP 114/79   Pulse 82   Ht 5\' 6"  (1.676 m)   Wt 162 lb (73.5 kg)   BMI 26.15 kg/m  CONSTITUTIONAL: Well-developed, well-nourished female in no acute distress.  HENT:  Normocephalic, atraumatic NECK: Normal range of motion NEUROLOGIC: Alert and oriented to person, place, and time PSYCHIATRIC: Normal mood and affect CARDIOVASCULAR: Normal heart rate noted RESPIRATORY: Effort and rate normal MUSCULOSKELETAL: Normal range of motion  Labs and Imaging Results for orders placed or performed in visit on 08/10/19 (from the past 24 hour(s))  POCT urine pregnancy     Status: Abnormal   Collection Time: 08/10/19  9:59 AM  Result Value Ref Range   Preg Test, Ur Positive (A) Negative    Assessment & Plan:   1. Encounter for IUD insertion   2. Positive pregnancy test   - UPT x3 positive today - denies IC or any chance for pregnancy - discussed false positives with UPTs - will obtain qhcg - reschedule IUD insertion for next week   Total face-to-face time with patient: 10 minutes   10/11/19, CNM 08/10/2019 10:41 AM

## 2019-08-24 ENCOUNTER — Other Ambulatory Visit: Payer: Self-pay

## 2019-08-24 ENCOUNTER — Encounter: Payer: Self-pay | Admitting: Certified Nurse Midwife

## 2019-08-24 ENCOUNTER — Ambulatory Visit (INDEPENDENT_AMBULATORY_CARE_PROVIDER_SITE_OTHER): Payer: Medicaid Other | Admitting: Certified Nurse Midwife

## 2019-08-24 VITALS — BP 124/66 | HR 85 | Ht 66.0 in | Wt 166.0 lb

## 2019-08-24 DIAGNOSIS — Z30430 Encounter for insertion of intrauterine contraceptive device: Secondary | ICD-10-CM | POA: Diagnosis not present

## 2019-08-24 DIAGNOSIS — Z3202 Encounter for pregnancy test, result negative: Secondary | ICD-10-CM

## 2019-08-24 LAB — POCT URINE PREGNANCY: Preg Test, Ur: NEGATIVE

## 2019-08-24 MED ORDER — LEVONORGESTREL 19.5 MCG/DAY IU IUD: INTRAUTERINE_SYSTEM | Freq: Once | INTRAUTERINE | Status: AC

## 2019-08-24 NOTE — Progress Notes (Signed)
   GYNECOLOGY OFFICE VISIT NOTE  History:  34 y.o. [id number] here today for IUD insertion. She denies any abnormal vaginal discharge, bleeding, pelvic pain or other concerns. She has not had sex since delivery.   Past Medical History:  Diagnosis Date  . Anemia   . Vaginal Pap smear, abnormal     History reviewed. No pertinent surgical history.  The following portions of the patient's history were reviewed and updated as appropriate: allergies, current medications, past family history, past medical history, past social history, past surgical history and problem list.   Health Maintenance:  Normal pap and negative HRHPV on 01/12/16.   Review of Systems:  Negative except noted in HPI   Objective:  Physical Exam BP 124/66   Pulse 85   Ht 5\' 6"  (1.676 m)   Wt 166 lb (75.3 kg)   LMP 08/23/2019   Breastfeeding No   BMI 26.79 kg/m  CONSTITUTIONAL: Well-developed, well-nourished female in no acute distress.  HENT:  Normocephalic, atraumatic NECK: Normal range of motion SKIN: Skin is warm and dry NEUROLOGIC: Alert and oriented to person, place, and time PSYCHIATRIC: Normal mood and affect CARDIOVASCULAR: Normal heart rate noted RESPIRATORY: Effort and rate normal PELVIC: Normal appearing external genitalia; normal appearing vaginal mucosa and cervix.  No abnormal discharge noted.  Normal uterine size, no other palpable masses, no uterine or adnexal tenderness. MUSCULOSKELETAL: Normal range of motion  Labs and Imaging Results for orders placed or performed in visit on 08/24/19 (from the past 24 hour(s))  POCT urine pregnancy     Status: None   Collection Time: 08/24/19 10:12 AM  Result Value Ref Range   Preg Test, Ur Negative Negative   Procedure Note: Pt consented for IUD insertion.  Pt placed in lithotomy position.  Speculum inserted and cervix cleaned with betadine solution.  Hurricane spray applied. Uterus sounded to 8 cm; Liletta IUD inserted without difficulty.  Strings  cut at approximately 3 cm.  Speculum removed. Tolerated well.  Assessment & Plan:   1. Encounter for IUD insertion   -Follow up in 1 month for string check and Pap  08/26/19, CNM 08/24/2019 11:15 AM

## 2019-09-25 ENCOUNTER — Ambulatory Visit (INDEPENDENT_AMBULATORY_CARE_PROVIDER_SITE_OTHER): Payer: Medicaid Other | Admitting: Certified Nurse Midwife

## 2019-09-25 ENCOUNTER — Encounter: Payer: Self-pay | Admitting: Certified Nurse Midwife

## 2019-09-25 ENCOUNTER — Other Ambulatory Visit (HOSPITAL_COMMUNITY)
Admission: RE | Admit: 2019-09-25 | Discharge: 2019-09-25 | Disposition: A | Discharge status: Home / Self Care | Payer: Medicaid Other | Source: Ambulatory Visit | Attending: Certified Nurse Midwife | Admitting: Certified Nurse Midwife

## 2019-09-25 ENCOUNTER — Other Ambulatory Visit: Payer: Self-pay

## 2019-09-25 VITALS — BP 125/84 | HR 77 | Ht 65.0 in | Wt 165.0 lb

## 2019-09-25 DIAGNOSIS — Z124 Encounter for screening for malignant neoplasm of cervix: Secondary | ICD-10-CM | POA: Diagnosis not present

## 2019-09-25 DIAGNOSIS — Z975 Presence of (intrauterine) contraceptive device: Secondary | ICD-10-CM | POA: Diagnosis not present

## 2019-09-25 NOTE — Patient Instructions (Signed)
Levonorgestrel intrauterine device (IUD) What is this medicine? LEVONORGESTREL IUD (LEE voe nor jes trel) is a contraceptive (birth control) device. The device is placed inside the uterus by a healthcare professional. It is used to prevent pregnancy. This device can also be used to treat heavy bleeding that occurs during your period. This medicine may be used for other purposes; ask your health care provider or pharmacist if you have questions. COMMON BRAND NAME(S): Kyleena, LILETTA, Mirena, Skyla What should I tell my health care provider before I take this medicine? They need to know if you have any of these conditions:  abnormal Pap smear  cancer of the breast, uterus, or cervix  diabetes  endometritis  genital or pelvic infection now or in the past  have more than one sexual partner or your partner has more than one partner  heart disease  history of an ectopic or tubal pregnancy  immune system problems  IUD in place  liver disease or tumor  problems with blood clots or take blood-thinners  seizures  use intravenous drugs  uterus of unusual shape  vaginal bleeding that has not been explained  an unusual or allergic reaction to levonorgestrel, other hormones, silicone, or polyethylene, medicines, foods, dyes, or preservatives  pregnant or trying to get pregnant  breast-feeding How should I use this medicine? This device is placed inside the uterus by a health care professional. Talk to your pediatrician regarding the use of this medicine in children. Special care may be needed. Overdosage: If you think you have taken too much of this medicine contact a poison control center or emergency room at once. NOTE: This medicine is only for you. Do not share this medicine with others. What if I miss a dose? This does not apply. Depending on the brand of device you have inserted, the device will need to be replaced every 3 to 6 years if you wish to continue using this type  of birth control. What may interact with this medicine? Do not take this medicine with any of the following medications:  amprenavir  bosentan  fosamprenavir This medicine may also interact with the following medications:  aprepitant  armodafinil  barbiturate medicines for inducing sleep or treating seizures  bexarotene  boceprevir  griseofulvin  medicines to treat seizures like carbamazepine, ethotoin, felbamate, oxcarbazepine, phenytoin, topiramate  modafinil  pioglitazone  rifabutin  rifampin  rifapentine  some medicines to treat HIV infection like atazanavir, efavirenz, indinavir, lopinavir, nelfinavir, tipranavir, ritonavir  St. John's wort  warfarin This list may not describe all possible interactions. Give your health care provider a list of all the medicines, herbs, non-prescription drugs, or dietary supplements you use. Also tell them if you smoke, drink alcohol, or use illegal drugs. Some items may interact with your medicine. What should I watch for while using this medicine? Visit your doctor or health care professional for regular check ups. See your doctor if you or your partner has sexual contact with others, becomes HIV positive, or gets a sexual transmitted disease. This product does not protect you against HIV infection (AIDS) or other sexually transmitted diseases. You can check the placement of the IUD yourself by reaching up to the top of your vagina with clean fingers to feel the threads. Do not pull on the threads. It is a good habit to check placement after each menstrual period. Call your doctor right away if you feel more of the IUD than just the threads or if you cannot feel the threads at   all. The IUD may come out by itself. You may become pregnant if the device comes out. If you notice that the IUD has come out use a backup birth control method like condoms and call your health care provider. Using tampons will not change the position of the  IUD and are okay to use during your period. This IUD can be safely scanned with magnetic resonance imaging (MRI) only under specific conditions. Before you have an MRI, tell your healthcare provider that you have an IUD in place, and which type of IUD you have in place. What side effects may I notice from receiving this medicine? Side effects that you should report to your doctor or health care professional as soon as possible:  allergic reactions like skin rash, itching or hives, swelling of the face, lips, or tongue  fever, flu-like symptoms  genital sores  high blood pressure  no menstrual period for 6 weeks during use  pain, swelling, warmth in the leg  pelvic pain or tenderness  severe or sudden headache  signs of pregnancy  stomach cramping  sudden shortness of breath  trouble with balance, talking, or walking  unusual vaginal bleeding, discharge  yellowing of the eyes or skin Side effects that usually do not require medical attention (report to your doctor or health care professional if they continue or are bothersome):  acne  breast pain  change in sex drive or performance  changes in weight  cramping, dizziness, or faintness while the device is being inserted  headache  irregular menstrual bleeding within first 3 to 6 months of use  nausea This list may not describe all possible side effects. Call your doctor for medical advice about side effects. You may report side effects to FDA at 1-800-FDA-1088. Where should I keep my medicine? This does not apply. NOTE: This sheet is a summary. It may not cover all possible information. If you have questions about this medicine, talk to your doctor, pharmacist, or health care provider.  2020 Elsevier/Gold Standard (2017-11-29 13:22:01)  

## 2019-09-25 NOTE — Progress Notes (Signed)
GYNECOLOGY OFFICE VISIT NOTE  History:  34 y.o. [id number] here today for IUD string check. Liletta IUD was placed on 08/24/19. She denies any abnormal vaginal discharge, pelvic pain or other concerns. Having daily spotting/bleeding, brownish red spotting. No dyspareunia.   Past Medical History:  Diagnosis Date  . Anemia   . Vaginal Pap smear, abnormal     History reviewed. No pertinent surgical history.  The following portions of the patient's history were reviewed and updated as appropriate: allergies, current medications, past family history, past medical history, past social history, past surgical history and problem list.   Review of Systems:  Pertinent items noted in HPI and remainder of comprehensive ROS otherwise negative.  Objective:  Physical Exam BP 125/84   Pulse 77   Ht 5\' 5"  (1.651 m)   Wt 165 lb (74.8 kg)   BMI 27.46 kg/m  CONSTITUTIONAL: Well-developed, well-nourished female in no acute distress.  HENT:  Normocephalic, atraumatic.  SKIN: Skin is warm and dry. No rash noted. Not diaphoretic. No erythema. No pallor. NEUROLOGIC: Alert and oriented to person, place, and time.  PSYCHIATRIC: Normal mood and affect. Normal behavior. Normal judgment and thought content. CARDIOVASCULAR: Normal heart rate noted RESPIRATORY: Effort and rate normal ABDOMEN: Soft, no distention noted.   PELVIC: Normal appearing external genitalia; normal appearing vaginal mucosa and cervix. IUD string seen. Pap smear collected. No abnormal discharge noted.    Assessment & Plan:  1. IUD (intrauterine device) in place - IUD in place  - Patient reports occasional spotting, encouraged patient that this is normal, usually resolves around 2 months but can take up to 3 months  - Discussed with patient to call if bleeding not getting better or resolving and will discuss other options   2. Encounter for Papanicolaou smear for cervical cancer screening - Cytology - PAP( [place])   Please refer  to After Visit Summary for other counseling recommendations.   , CNM 09/25/2019 9:24 AM

## 2019-09-27 LAB — CYTOLOGY - PAP
Comment: NEGATIVE
Diagnosis: NEGATIVE
High risk HPV: NEGATIVE

## 2020-01-15 ENCOUNTER — Encounter: Payer: Self-pay | Admitting: Certified Nurse Midwife

## 2020-01-15 ENCOUNTER — Other Ambulatory Visit: Payer: Self-pay

## 2020-01-15 ENCOUNTER — Ambulatory Visit: Payer: Medicaid Other | Admitting: Certified Nurse Midwife

## 2020-01-15 VITALS — BP 122/75 | HR 87 | Ht 65.0 in | Wt 178.0 lb

## 2020-01-15 DIAGNOSIS — Z30013 Encounter for initial prescription of injectable contraceptive: Secondary | ICD-10-CM | POA: Diagnosis not present

## 2020-01-15 DIAGNOSIS — Z30432 Encounter for removal of intrauterine contraceptive device: Secondary | ICD-10-CM

## 2020-01-15 MED ORDER — MEDROXYPROGESTERONE ACETATE 150 MG/ML IM SUSP: 150.0000 mg | INTRAMUSCULAR | 3 refills | Status: DC

## 2020-01-15 NOTE — Patient Instructions (Signed)
Medroxyprogesterone injection [Contraceptive] What is this medicine? MEDROXYPROGESTERONE (me DROX ee proe JES te rone) contraceptive injections prevent pregnancy. They provide effective birth control for 3 months. Depo-subQ Provera 104 is also used for treating pain related to endometriosis. This medicine may be used for other purposes; ask your health care provider or pharmacist if you have questions. COMMON BRAND NAME(S): Depo-Provera, Depo-subQ Provera 104 What should I tell my health care provider before I take this medicine? They need to know if you have any of these conditions:  frequently drink alcohol  asthma  blood vessel disease or a history of a blood clot in the lungs or legs  bone disease such as osteoporosis  breast cancer  diabetes  eating disorder (anorexia nervosa or bulimia)  high blood pressure  HIV infection or AIDS  kidney disease  liver disease  mental depression  migraine  seizures (convulsions)  stroke  tobacco smoker  vaginal bleeding  an unusual or allergic reaction to medroxyprogesterone, other hormones, medicines, foods, dyes, or preservatives  pregnant or trying to get pregnant  breast-feeding How should I use this medicine? Depo-Provera Contraceptive injection is given into a muscle. Depo-subQ Provera 104 injection is given under the skin. These injections are given by a health care professional. You must not be pregnant before getting an injection. The injection is usually given during the first 5 days after the start of a menstrual period or 6 weeks after delivery of a baby. Talk to your pediatrician regarding the use of this medicine in children. Special care may be needed. These injections have been used in female children who have started having menstrual periods. Overdosage: If you think you have taken too much of this medicine contact a poison control center or emergency room at once. NOTE: This medicine is only for you. Do not  share this medicine with others. What if I miss a dose? Try not to miss a dose. You must get an injection once every 3 months to maintain birth control. If you cannot keep an appointment, call and reschedule it. If you wait longer than 13 weeks between Depo-Provera contraceptive injections or longer than 14 weeks between Depo-subQ Provera 104 injections, you could get pregnant. Use another method for birth control if you miss your appointment. You may also need a pregnancy test before receiving another injection. What may interact with this medicine? Do not take this medicine with any of the following medications:  bosentan This medicine may also interact with the following medications:  aminoglutethimide  antibiotics or medicines for infections, especially rifampin, rifabutin, rifapentine, and griseofulvin  aprepitant  barbiturate medicines such as phenobarbital or primidone  bexarotene  carbamazepine  medicines for seizures like ethotoin, felbamate, oxcarbazepine, phenytoin, topiramate  modafinil  St. John's wort This list may not describe all possible interactions. Give your health care provider a list of all the medicines, herbs, non-prescription drugs, or dietary supplements you use. Also tell them if you smoke, drink alcohol, or use illegal drugs. Some items may interact with your medicine. What should I watch for while using this medicine? This drug does not protect you against HIV infection (AIDS) or other sexually transmitted diseases. Use of this product may cause you to lose calcium from your bones. Loss of calcium may cause weak bones (osteoporosis). Only use this product for more than 2 years if other forms of birth control are not right for you. The longer you use this product for birth control the more likely you will be at risk   for weak bones. Ask your health care professional how you can keep strong bones. You may have a change in bleeding pattern or irregular periods.  Many females stop having periods while taking this drug. If you have received your injections on time, your chance of being pregnant is very low. If you think you may be pregnant, see your health care professional as soon as possible. Tell your health care professional if you want to get pregnant within the next year. The effect of this medicine may last a long time after you get your last injection. What side effects may I notice from receiving this medicine? Side effects that you should report to your doctor or health care professional as soon as possible:  allergic reactions like skin rash, itching or hives, swelling of the face, lips, or tongue  breast tenderness or discharge  breathing problems  changes in vision  depression  feeling faint or lightheaded, falls  fever  pain in the abdomen, chest, groin, or leg  problems with balance, talking, walking  unusually weak or tired  yellowing of the eyes or skin Side effects that usually do not require medical attention (report to your doctor or health care professional if they continue or are bothersome):  acne  fluid retention and swelling  headache  irregular periods, spotting, or absent periods  temporary pain, itching, or skin reaction at site where injected  weight gain This list may not describe all possible side effects. Call your doctor for medical advice about side effects. You may report side effects to FDA at 1-800-FDA-1088. Where should I keep my medicine? This does not apply. The injection will be given to you by a health care professional. NOTE: This sheet is a summary. It may not cover all possible information. If you have questions about this medicine, talk to your doctor, pharmacist, or health care provider.  2020 Elsevier/Gold Standard (2008-02-09 18:37:56)  

## 2020-01-15 NOTE — Progress Notes (Signed)
    GYNECOLOGY CLINIC PROCEDURE NOTE  Ms. Kathleen Morrison is a 34 y.o. [phone] here for Liletta IUD removal. Patient reports continued vaginal bleeding with IUD. Patient also reports stress incontinence- educated and discussed pelvic PT with patient and exercises- patient declines PT at this time and will do exercises and workouts.  Last pap smear was on 09/25/19 and was normal.  IUD Removal  Patient was in the dorsal lithotomy position, normal external genitalia was noted.  A speculum was placed in the patient's vagina, normal discharge was noted, no lesions. The multiparous cervix was visualized, no lesions, no abnormal discharge.  The strings of the IUD were grasped and pulled using ring forceps. The IUD was removed in its entirety. Patient tolerated the procedure well.    Patient will use Depo for contraception. Routine preventative health maintenance measures emphasized. Rx for Depo sent to pharmacy on file.   Sharyon Cable, CNM 01/15/2020 10:11 AM

## 2020-01-15 NOTE — Progress Notes (Signed)
Pt wants IUD removed due to constant bleeding- wants to start Depo Provera

## 2020-01-30 ENCOUNTER — Ambulatory Visit (INDEPENDENT_AMBULATORY_CARE_PROVIDER_SITE_OTHER): Payer: Medicaid Other | Admitting: *Deleted

## 2020-01-30 ENCOUNTER — Encounter: Payer: Self-pay | Admitting: *Deleted

## 2020-01-30 ENCOUNTER — Other Ambulatory Visit: Payer: Self-pay

## 2020-01-30 DIAGNOSIS — Z30013 Encounter for initial prescription of injectable contraceptive: Secondary | ICD-10-CM

## 2020-01-30 MED ORDER — MEDROXYPROGESTERONE ACETATE 150 MG/ML IM SUSP
150.0000 mg | INTRAMUSCULAR | Status: AC
  Administered 2020-01-30: 16:00:00 150 mg via INTRAMUSCULAR

## 2020-01-30 NOTE — Progress Notes (Signed)
Pt here for Depo Provera injection only.  Pt does provide her meds

## 2020-04-23 ENCOUNTER — Ambulatory Visit (INDEPENDENT_AMBULATORY_CARE_PROVIDER_SITE_OTHER): Payer: Medicaid Other

## 2020-04-23 ENCOUNTER — Ambulatory Visit: Payer: Medicaid Other

## 2020-04-23 ENCOUNTER — Other Ambulatory Visit: Payer: Self-pay

## 2020-04-23 DIAGNOSIS — Z3042 Encounter for surveillance of injectable contraceptive: Secondary | ICD-10-CM

## 2020-04-23 NOTE — Progress Notes (Signed)
Patient is [place] patient that came to [facility] for her injection due to having to pick it up from the pharmacy.   Patient given Depo Provera. Patient states she will call [facility] to schedule her next appointment. Armandina Stammer RN

## 2020-04-23 NOTE — Progress Notes (Signed)
Chart reviewed - agree with CMA/RN documentation.  ° °

## 2020-08-13 ENCOUNTER — Other Ambulatory Visit: Payer: Self-pay | Admitting: Obstetrics & Gynecology

## 2020-08-13 DIAGNOSIS — Z3A40 40 weeks gestation of pregnancy: Secondary | ICD-10-CM

## 2020-10-22 ENCOUNTER — Other Ambulatory Visit: Payer: Self-pay

## 2020-10-22 ENCOUNTER — Telehealth: Payer: 59 | Admitting: Nurse Practitioner

## 2020-10-22 ENCOUNTER — Ambulatory Visit
Admission: EM | Admit: 2020-10-22 | Discharge: 2020-10-22 | Disposition: A | Discharge status: Home / Self Care | Payer: 59 | Attending: Emergency Medicine | Admitting: Emergency Medicine

## 2020-10-22 DIAGNOSIS — J020 Streptococcal pharyngitis: Secondary | ICD-10-CM | POA: Insufficient documentation

## 2020-10-22 DIAGNOSIS — B349 Viral infection, unspecified: Secondary | ICD-10-CM | POA: Diagnosis not present

## 2020-10-22 DIAGNOSIS — J029 Acute pharyngitis, unspecified: Secondary | ICD-10-CM

## 2020-10-22 DIAGNOSIS — R059 Cough, unspecified: Secondary | ICD-10-CM | POA: Diagnosis not present

## 2020-10-22 DIAGNOSIS — J302 Other seasonal allergic rhinitis: Secondary | ICD-10-CM | POA: Diagnosis not present

## 2020-10-22 LAB — POCT INFLUENZA A/B
Influenza A, POC: NEGATIVE
Influenza B, POC: NEGATIVE

## 2020-10-22 LAB — POCT RAPID STREP A (OFFICE): Rapid Strep A Screen: NEGATIVE

## 2020-10-22 MED ORDER — FLUTICASONE PROPIONATE 50 MCG/ACT NA SUSP: 2.0000 | Freq: Every day | NASAL | 0 refills | Status: DC

## 2020-10-22 NOTE — ED Provider Notes (Signed)
[facility]    CSN: [id number] Arrival date & time: 10/22/20  0813      History   Chief Complaint Chief Complaint  Patient presents with   Sore Throat   Generalized Body Aches    HPI Kathleen Morrison is a 35 y.o. female.   Patient complains of sore throat that started 4 days ago states she also feels tired, is having body ache, mild cough, mild headache.  Patient denies nausea, vomiting, anorexia, fever, chills.  Patient denies history of allergies, currently not taking any medications to treat her symptoms.  Patient states that both of her children have been sick recently and she thinks she may have caught this from them.  Patient reports positive COVID diagnosis in August, denies complications from illness.  The history is provided by the patient.  Sore Throat   Past Medical History:  Diagnosis Date   Anemia    Vaginal Pap smear, abnormal     Patient Active Problem List   Diagnosis Date Noted   Gastroesophageal reflux disease with esophagitis 03/15/2016    History reviewed. No pertinent surgical history.  OB History     Gravida  3   Para  3   Term  3   Preterm      AB      Living  3      SAB      IAB      Ectopic      Multiple  0   Live Births  3        Obstetric Comments  Waterbirth ([place])          Home Medications    Prior to Admission medications   Medication Sig Start Date End Date Taking? Authorizing Provider  fluticasone (FLONASE) 50 MCG/ACT nasal spray Place 2 sprays into both nostrils daily. 10/22/20  Yes Theadora Rama Scales, PA-C  medroxyPROGESTERone (DEPO-PROVERA) 150 MG/ML injection Inject 1 mL (150 mg total) into the muscle every 3 (three) months. 01/15/20   Sharyon Cable, CNM  senna (SENOKOT) 8.6 MG TABS tablet Take 1 tablet (8.6 mg total) by mouth every other day. 06/25/19   Simmons-Robinson, Tawanna Cooler, MD    Family History Family History  Problem Relation Age of Onset   Diabetes Mother     Anxiety disorder Sister    Cerebral palsy Brother    Autism Brother     Social History Social History   Tobacco Use   Smoking status: Never   Smokeless tobacco: Never  Vaping Use   Vaping Use: Never used  Substance Use Topics   Alcohol use: Not Currently    Comment: occassionally   Drug use: No     Allergies   Pineapple   Review of Systems Review of Systems Per HPI  Physical Exam Triage Vital Signs ED Triage Vitals  Enc Vitals Group     BP      Pulse      Resp      Temp      Temp src      SpO2      Weight      Height      Head Circumference      Peak Flow      Pain Score      Pain Loc      Pain Edu?      Excl. in GC?    No data found.  Updated Vital Signs BP 121/78 (BP Location: Left Arm)   Pulse  93   Temp 98 F (36.7 C) (Oral)   Resp 20   LMP 02/12/2020 (Approximate) Comment: Had depo shot in the past and has not had a cycle since Jan 2022  SpO2 97%   Visual Acuity Right Eye Distance:   Left Eye Distance:   Bilateral Distance:    Right Eye Near:   Left Eye Near:    Bilateral Near:     Physical Exam Vitals and nursing note reviewed.  Constitutional:      Appearance: Normal appearance.  HENT:     Head: Normocephalic and atraumatic.     Right Ear: Tympanic membrane, ear canal and external ear normal.     Left Ear: Tympanic membrane, ear canal and external ear normal.     Nose: Rhinorrhea present.     Right Turbinates: Enlarged, swollen and pale.     Left Turbinates: Enlarged, swollen and pale.     Mouth/Throat:     Mouth: Mucous membranes are moist.     Pharynx: Oropharynx is clear. Posterior oropharyngeal erythema (Mild) present. No pharyngeal swelling, oropharyngeal exudate or uvula swelling.     Tonsils: No tonsillar exudate or tonsillar abscesses. 0 on the right. 0 on the left.  Eyes:     Extraocular Movements: Extraocular movements intact.     Conjunctiva/sclera: Conjunctivae normal.     Pupils: Pupils are equal, round, and  reactive to light.  Cardiovascular:     Rate and Rhythm: Normal rate and regular rhythm.     Pulses: Normal pulses.     Heart sounds: Normal heart sounds.  Pulmonary:     Effort: Pulmonary effort is normal.     Breath sounds: Normal breath sounds.  Abdominal:     General: Abdomen is flat. Bowel sounds are normal.     Palpations: Abdomen is soft.  Musculoskeletal:        General: Normal range of motion.     Cervical back: Normal range of motion and neck supple.  Skin:    General: Skin is warm and dry.  Neurological:     General: No focal deficit present.     Mental Status: She is alert and oriented to person, place, and time. Mental status is at baseline.  Psychiatric:        Mood and Affect: Mood normal.        Behavior: Behavior normal.     UC Treatments / Results  Labs (all labs ordered are listed, but only abnormal results are displayed) Labs Reviewed  CULTURE, GROUP A STREP ([facility])  COVID-19, FLU A+B NAA  POCT RAPID STREP A (OFFICE)  POCT INFLUENZA A/B    EKG   Radiology No results found.  Procedures Procedures (including critical care time)  Medications Ordered in UC Medications - No data to display  Initial Impression / Assessment and Plan / UC Course  I have reviewed the triage vital signs and the nursing notes.  Pertinent labs & imaging results that were available during my care of the patient were reviewed by me and considered in my medical decision making (see chart for details).     Patient was tested for rapid strep and rapid flu, both were negative.  We will send cultures for both.  Based on patient's history and physical exam findings as well as family history, suspect patient has chronic seasonal allergies that up to this point have really never bothered her.  It is most likely that her recent COVID infection has made her a little more  reactive and that is why her allergy symptoms are worse this year.  I recommend patient begin Flonase and continue  conservative care with Tylenol, ibuprofen, decongestants and cough suppressants as needed.  Patient verbalized agreement with plan as outlined, all questions were addressed during visit. Final Clinical Impressions(s) / UC Diagnoses   Final diagnoses:  sore throat  Cough  Viral illness  Seasonal allergies     Discharge Instructions      The results of your COVID, flu and bacterial throat cultures will be made available to you once they are received.  In the meantime, please begin Flonase to help alleviate some of the edema and congestion in your upper airway, this may or may not improve some of your symptoms with sore throat.  In the meantime your illness is viral in etiology, conservative care is recommended with ibuprofen, Tylenol, cough suppressants and decongestants.  If you continue to have high fever, throat pain worsens please feel free to return for reevaluation.     ED Prescriptions     Medication Sig Dispense Auth. Provider   fluticasone (FLONASE) 50 MCG/ACT nasal spray Place 2 sprays into both nostrils daily. 18 mL Theadora Rama Scales, PA-C      PDMP not reviewed this encounter.   Theadora Rama Scales, [place] 10/22/20 [phone]

## 2020-10-22 NOTE — Discharge Instructions (Addendum)
The results of your COVID, flu and bacterial throat cultures will be made available to you once they are received.  In the meantime, please begin Flonase to help alleviate some of the edema and congestion in your upper airway, this may or may not improve some of your symptoms with sore throat.  In the meantime your illness is viral in etiology, conservative care is recommended with ibuprofen, Tylenol, cough suppressants and decongestants.  If you continue to have high fever, throat pain worsens please feel free to return for reevaluation.

## 2020-10-22 NOTE — ED Triage Notes (Addendum)
Pt reports having sore throat, body aches that started Sunday.   Pt reports dropping something on her left great toe last week, she states it is still causing pain. She is able to move toe at this time.

## 2020-10-22 NOTE — Progress Notes (Signed)
  E-Visit for Sore Throat  We are sorry that you are not feeling well.  Here is how we plan to help!  Your symptoms indicate a likely viral infection (Pharyngitis).   Pharyngitis is inflammation in the back of the throat which can cause a sore throat, scratchiness and sometimes difficulty swallowing.   Pharyngitis is typically caused by a respiratory virus and will just run its course.  Please keep in mind that your symptoms could last up to 10 days.  For throat pain, we recommend over the counter oral pain relief medications such as acetaminophen or aspirin, or anti-inflammatory medications such as ibuprofen or naproxen sodium.  Topical treatments such as oral throat lozenges or sprays may be used as needed.  Avoid close contact with loved ones, especially the very young and elderly.  Remember to wash your hands thoroughly throughout the day as this is the number one way to prevent the spread of infection and wipe down door knobs and counters with disinfectant.  After careful review of your answers, I would not recommend and antibiotic for your condition.  Antibiotics should not be used to treat conditions that we suspect are caused by viruses like the virus that causes the common cold or flu. However, some people can have Strep with atypical symptoms. You may need formal testing in clinic or office to confirm if your symptoms continue or worsen.  Providers prescribe antibiotics to treat infections caused by bacteria. Antibiotics are very powerful in treating bacterial infections when they are used properly.  To maintain their effectiveness, they should be used only when necessary.  Overuse of antibiotics has resulted in the development of super bugs that are resistant to treatment!    Home Care: Only take medications as instructed by your medical team. Do not drink alcohol while taking these medications. A steam or ultrasonic humidifier can help congestion.  You can place a towel over your head and  breathe in the steam from hot water coming from a faucet. Avoid close contacts especially the very young and the elderly. Cover your mouth when you cough or sneeze. Always remember to wash your hands.  Get Help Right Away If: You develop worsening fever or throat pain. You develop a severe head ache or visual changes. Your symptoms persist after you have completed your treatment plan.  Make sure you Understand these instructions. Will watch your condition. Will get help right away if you are not doing well or get worse.   Thank you for choosing an e-visit.  Your e-visit answers were reviewed by a board certified advanced clinical practitioner to complete your personal care plan. Depending upon the condition, your plan could have included both over the counter or prescription medications.  Please review your pharmacy choice. Make sure the pharmacy is open so you can pick up prescription now. If there is a problem, you may contact your provider through MyChart messaging and have the prescription routed to another pharmacy.  Your safety is important to us. If you have drug allergies check your prescription carefully.   For the next 24 hours you can use MyChart to ask questions about today's visit, request a non-urgent call back, or ask for a work or school excuse. You will get an email in the next two days asking about your experience. I hope that your e-visit has been valuable and will speed your recovery.  5-10 minutes spent reviewing and documenting in chart.  

## 2020-10-24 LAB — COVID-19, FLU A+B NAA
Influenza A, NAA: NOT DETECTED
Influenza B, NAA: NOT DETECTED
SARS-CoV-2, NAA: NOT DETECTED

## 2020-10-24 LAB — CULTURE, GROUP A STREP (THRC)

## 2021-07-22 ENCOUNTER — Ambulatory Visit
Admission: EM | Admit: 2021-07-22 | Discharge: 2021-07-22 | Disposition: A | Discharge status: Home / Self Care | Payer: Medicaid Other | Attending: Emergency Medicine | Admitting: Emergency Medicine

## 2021-07-22 ENCOUNTER — Encounter: Payer: Self-pay | Admitting: Emergency Medicine

## 2021-07-22 ENCOUNTER — Other Ambulatory Visit: Payer: Self-pay

## 2021-07-22 DIAGNOSIS — Z20818 Contact with and (suspected) exposure to other bacterial communicable diseases: Secondary | ICD-10-CM

## 2021-07-22 DIAGNOSIS — J302 Other seasonal allergic rhinitis: Secondary | ICD-10-CM

## 2021-07-22 LAB — POCT RAPID STREP A (OFFICE): Rapid Strep A Screen: NEGATIVE

## 2021-07-22 MED ORDER — LEVOCETIRIZINE DIHYDROCHLORIDE 5 MG PO TABS: 5.0000 mg | ORAL_TABLET | Freq: Every evening | ORAL | 1 refills | Status: AC

## 2021-07-22 MED ORDER — FLUTICASONE PROPIONATE 50 MCG/ACT NA SUSP: 1.0000 | Freq: Every day | NASAL | 1 refills | Status: AC

## 2021-07-22 NOTE — ED Provider Notes (Signed)
[facility]    CSN: [id number] Arrival date & time: 07/22/21  1608    HISTORY   Chief Complaint  Patient presents with   Sore Throat    My child has strep throat I think I do two - Entered by patient   HPI Kathleen Morrison is a 36 y.o. female. Patient presents to [facility] for evaluation of scratchy throat, ear fullness and popping, subjective fever and fatigue x 4-5 days.  States her daughter was diagnosed with strep throat today.  Patient has a history of seasonal allergies, has been seen at this clinic by me in the past with similar complaints.  Patient states she is not currently taking allergy medication on a regular basis.  The history is provided by the patient.   Past Medical History:  Diagnosis Date   Anemia    Vaginal Pap smear, abnormal    Patient Active Problem List   Diagnosis Date Noted   Gastroesophageal reflux disease with esophagitis 03/15/2016   History reviewed. No pertinent surgical history. OB History     Gravida  3   Para  3   Term  3   Preterm      AB      Living  3      SAB      IAB      Ectopic      Multiple  0   Live Births  3        Obstetric Comments  Waterbirth ([place])        Home Medications    Prior to Admission medications   Not on File   Family History Family History  Problem Relation Age of Onset   Diabetes Mother    Anxiety disorder Sister    Cerebral palsy Brother    Autism Brother    Social History Social History   Tobacco Use   Smoking status: Never   Smokeless tobacco: Never  Vaping Use   Vaping Use: Never used  Substance Use Topics   Alcohol use: Not Currently    Comment: occassionally   Drug use: No   Allergies   Pineapple  Review of Systems Review of Systems Pertinent findings noted in history of present illness.   Physical Exam Triage Vital Signs ED Triage Vitals  Enc Vitals Group     BP 11/28/20 0827 (!) 147/82     Pulse Rate 11/28/20 0827 72     Resp 11/28/20  0827 18     Temp 11/28/20 0827 98.3 F (36.8 C)     Temp Source 11/28/20 0827 Oral     SpO2 11/28/20 0827 98 %     Weight --      Height --      Head Circumference --      Peak Flow --      Pain Score 11/28/20 0826 5     Pain Loc --      Pain Edu? --      Excl. in [place]? --   No data found.  Updated Vital Signs BP 125/82 (BP Location: Right Arm)   Pulse (!) 107   Temp 99.1 F (37.3 C) (Oral)   Resp 18   SpO2 98%   Physical Exam Vitals and nursing note reviewed.  Constitutional:      General: She is not in acute distress.    Appearance: Normal appearance. She is not ill-appearing.  HENT:     Head: Normocephalic and atraumatic.     Salivary  Glands: Right salivary gland is not diffusely enlarged or tender. Left salivary gland is not diffusely enlarged or tender.     Right Ear: Ear canal and external ear normal. No drainage. A middle ear effusion is present. There is no impacted cerumen. Tympanic membrane is bulging. Tympanic membrane is not injected or erythematous.     Left Ear: Ear canal and external ear normal. No drainage. A middle ear effusion is present. There is no impacted cerumen. Tympanic membrane is bulging. Tympanic membrane is not injected or erythematous.     Ears:     Comments: Bilateral EACs normal, both TMs bulging with clear fluid    Nose: Rhinorrhea present. No nasal deformity, septal deviation, signs of injury, nasal tenderness, mucosal edema or congestion. Rhinorrhea is clear.     Right Nostril: Occlusion present. No foreign body, epistaxis or septal hematoma.     Left Nostril: Occlusion present. No foreign body, epistaxis or septal hematoma.     Right Turbinates: Enlarged, swollen and pale.     Left Turbinates: Enlarged, swollen and pale.     Right Sinus: No maxillary sinus tenderness or frontal sinus tenderness.     Left Sinus: No maxillary sinus tenderness or frontal sinus tenderness.     Mouth/Throat:     Lips: Pink. No lesions.     Mouth: Mucous  membranes are moist. No oral lesions.     Pharynx: Oropharynx is clear. Uvula midline. No posterior oropharyngeal erythema or uvula swelling.     Tonsils: No tonsillar exudate. 0 on the right. 0 on the left.     Comments: Postnasal drip Eyes:     General: Lids are normal.        Right eye: No discharge.        Left eye: No discharge.     Extraocular Movements: Extraocular movements intact.     Conjunctiva/sclera: Conjunctivae normal.     Right eye: Right conjunctiva is not injected.     Left eye: Left conjunctiva is not injected.  Neck:     Trachea: Trachea and phonation normal.  Cardiovascular:     Rate and Rhythm: Normal rate and regular rhythm.     Pulses: Normal pulses.     Heart sounds: Normal heart sounds. No murmur heard.    No friction rub. No gallop.  Pulmonary:     Effort: Pulmonary effort is normal. No accessory muscle usage, prolonged expiration or respiratory distress.     Breath sounds: Normal breath sounds. No stridor, decreased air movement or transmitted upper airway sounds. No decreased breath sounds, wheezing, rhonchi or rales.  Chest:     Chest wall: No tenderness.  Musculoskeletal:        General: Normal range of motion.     Cervical back: Normal range of motion and neck supple. Normal range of motion.  Lymphadenopathy:     Cervical: No cervical adenopathy.  Skin:    General: Skin is warm and dry.     Findings: No erythema or rash.  Neurological:     General: No focal deficit present.     Mental Status: She is alert and oriented to person, place, and time.  Psychiatric:        Mood and Affect: Mood normal.        Behavior: Behavior normal.     Visual Acuity Right Eye Distance:   Left Eye Distance:   Bilateral Distance:    Right Eye Near:   Left Eye Near:    Bilateral  Near:     UC Couse / Diagnostics / Procedures:    EKG  Radiology No results found.  Procedures Procedures (including critical care time)  UC Diagnoses / Final Clinical  Impressions(s)   I have reviewed the triage vital signs and the nursing notes.  Pertinent labs & imaging results that were available during my care of the patient were reviewed by me and considered in my medical decision making (see chart for details).   Final diagnoses:  Exposure to Streptococcal pharyngitis  Seasonal allergic rhinitis, unspecified trigger   Rapid strep test today is negative, throat culture pending, will provide antibiotics as indicated based on throat culture result.  Patient advised to resume allergy medications, prescription sent.  Return precautions advised.  ED Prescriptions     Medication Sig Dispense Auth. Provider   levocetirizine (XYZAL) 5 MG tablet Take 1 tablet (5 mg total) by mouth every evening. 90 tablet Theadora Rama Scales, PA-C   fluticasone (FLONASE) 50 MCG/ACT nasal spray Place 1 spray into both nostrils daily. 47.4 mL Theadora Rama Scales, PA-C      PDMP not reviewed this encounter.  Pending results:  Labs Reviewed  CULTURE, GROUP A STREP [facility])  POCT RAPID STREP A (OFFICE)    Medications Ordered in UC: Medications - No data to display  Disposition Upon Discharge:  Condition: stable for discharge home Home: take medications as prescribed; routine discharge instructions as discussed; follow up as advised.  Patient presented with an acute illness with associated systemic symptoms and significant discomfort requiring urgent management. In my opinion, this is a condition that a prudent lay person (someone who possesses an average knowledge of health and medicine) may potentially expect to result in complications if not addressed urgently such as respiratory distress, impairment of bodily function or dysfunction of bodily organs.   Routine symptom specific, illness specific and/or disease specific instructions were discussed with the patient and/or caregiver at length.   As such, the patient has been evaluated and assessed, work-up was  performed and treatment was provided in alignment with urgent care protocols and evidence based medicine.  Patient/parent/caregiver has been advised that the patient may require follow up for further testing and treatment if the symptoms continue in spite of treatment, as clinically indicated and appropriate.  If the patient was tested for COVID-19, Influenza and/or RSV, then the patient/parent/guardian was advised to isolate at home pending the results of his/her diagnostic coronavirus test and potentially longer if they're positive. I have also advised pt that if his/her COVID-19 test returns positive, it's recommended to self-isolate for at least 10 days after symptoms first appeared AND until fever-free for 24 hours without fever reducer AND other symptoms have improved or resolved. Discussed self-isolation recommendations as well as instructions for household member/close contacts as per the [facility] and [place] [facility]HS, and also gave patient the COVID packet with this information.  Patient/parent/caregiver has been advised to return [facility] or PCP in 3-5 days if no better; to PCP or the Emergency Department if new signs and symptoms develop, or if the current signs or symptoms continue to change or worsen for further workup, evaluation and treatment as clinically indicated and appropriate  The patient will follow up with their current PCP if and as advised. If the patient does not currently have a PCP we will assist them in obtaining one.   The patient may need specialty follow up if the symptoms continue, in spite of conservative treatment and management, for further workup, evaluation,  consultation and treatment as clinically indicated and appropriate.  Patient/parent/caregiver verbalized understanding and agreement of plan as discussed.  All questions were addressed during visit.  Please see discharge instructions below for further details of plan.  Discharge Instructions:   Discharge Instructions       Your strep test today is negative.  Streptococcal throat culture will be performed per our protocol.  The result of your throat culture will be posted to your MyChart once it is complete, this typically takes 3 to 5 days.     If your streptococcal throat culture is positive, you will be contacted by phone and antibiotics will be prescribed for you.     Your symptoms and my physical exam findings are concerning for exacerbation of your underlying allergies.     Please see the list below for recommended medications, dosages and frequencies to provide relief of current symptoms:     Xyzal (levocetirizine): This is an excellent second-generation antihistamine that helps to reduce respiratory inflammatory response to environmental allergens.  In some patients, this medication can cause daytime sleepiness so I recommend that you take 1 tablet daily at bedtime.     Flonase (fluticasone): This is a steroid nasal spray that you use once daily, 1 spray in each nare.  This medication does not work well if you decide to use it only used as you feel you need to, it works best used on a daily basis.  After 3 to 5 days of use, you will notice significant reduction of the inflammation and mucus production that is currently being caused by exposure to allergens, whether seasonal or environmental.  The most common side effect of this medication is nosebleeds.  If you experience a nosebleed, please discontinue use for 1 week, then feel free to resume.  I have provided you with a prescription.     If your insurance will not cover your allergy medications, please consider downloading the Good Rx app which is free.  You can find considerable discounts on prescription and over-the-counter medications.   If you find that you have not had improvement of your symptoms in the next 5 to 7 days, please follow-up with your primary care provider or return here to urgent care for repeat evaluation and further  recommendations.   Thank you for visiting urgent care today.  We appreciate the opportunity to participate in your care.       This office note has been dictated using Teaching laboratory technician.  Unfortunately, and despite my best efforts, this method of dictation can sometimes lead to occasional typographical or grammatical errors.  I apologize in advance if this occurs.     Theadora Rama Scales, PA-C 07/22/21 1646

## 2021-07-22 NOTE — Discharge Instructions (Signed)
Your strep test today is negative.  Streptococcal throat culture will be performed per our protocol.  The result of your throat culture will be posted to your MyChart once it is complete, this typically takes 3 to 5 days.     If your streptococcal throat culture is positive, you will be contacted by phone and antibiotics will be prescribed for you.     Your symptoms and my physical exam findings are concerning for exacerbation of your underlying allergies.     Please see the list below for recommended medications, dosages and frequencies to provide relief of current symptoms:     Xyzal (levocetirizine): This is an excellent second-generation antihistamine that helps to reduce respiratory inflammatory response to environmental allergens.  In some patients, this medication can cause daytime sleepiness so I recommend that you take 1 tablet daily at bedtime.     Flonase (fluticasone): This is a steroid nasal spray that you use once daily, 1 spray in each nare.  This medication does not work well if you decide to use it only used as you feel you need to, it works best used on a daily basis.  After 3 to 5 days of use, you will notice significant reduction of the inflammation and mucus production that is currently being caused by exposure to allergens, whether seasonal or environmental.  The most common side effect of this medication is nosebleeds.  If you experience a nosebleed, please discontinue use for 1 week, then feel free to resume.  I have provided you with a prescription.     If your insurance will not cover your allergy medications, please consider downloading the Good Rx app which is free.  You can find considerable discounts on prescription and over-the-counter medications.   If you find that you have not had improvement of your symptoms in the next 5 to 7 days, please follow-up with your primary care provider or return here to urgent care for repeat evaluation and further recommendations.    Thank you for visiting urgent care today.  We appreciate the opportunity to participate in your care.

## 2021-07-22 NOTE — ED Triage Notes (Signed)
Patient presents to [facility] for evaluation of sore throat, ear fullness, fatigue x 4-5 days.  States her daughter was diagnosed with strep throat today.

## 2021-07-25 LAB — CULTURE, GROUP A STREP (THRC)
